# Patient Record
Sex: Female | Born: 1985 | Race: White | Marital: Married | Smoking: Never smoker
Health system: Southern US, Community
[De-identification: ages and names within clinical notes are randomized; demographics above are authoritative.]

## PROBLEM LIST (undated history)

## (undated) DIAGNOSIS — C801 Malignant (primary) neoplasm, unspecified: Secondary | ICD-10-CM

## (undated) DIAGNOSIS — N289 Disorder of kidney and ureter, unspecified: Secondary | ICD-10-CM

## (undated) DIAGNOSIS — Z8489 Family history of other specified conditions: Secondary | ICD-10-CM

## (undated) DIAGNOSIS — T8859XA Other complications of anesthesia, initial encounter: Secondary | ICD-10-CM

## (undated) DIAGNOSIS — Q8500 Neurofibromatosis, unspecified: Secondary | ICD-10-CM

## (undated) DIAGNOSIS — Z5189 Encounter for other specified aftercare: Secondary | ICD-10-CM

## (undated) HISTORY — PX: KIDNEY TRANSPLANT: SHX239

## (undated) HISTORY — PX: OTHER SURGICAL HISTORY: SHX169

## (undated) HISTORY — PX: TONSILLECTOMY: SUR1361

---
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
## 2020-01-21 ENCOUNTER — Other Ambulatory Visit: Payer: Self-pay | Admitting: Sports Medicine

## 2020-01-21 DIAGNOSIS — R29898 Other symptoms and signs involving the musculoskeletal system: Secondary | ICD-10-CM

## 2020-01-21 DIAGNOSIS — M5416 Radiculopathy, lumbar region: Secondary | ICD-10-CM

## 2020-01-25 ENCOUNTER — Ambulatory Visit
Admission: RE | Admit: 2020-01-25 | Discharge: 2020-01-25 | Disposition: A | Discharge status: Home / Self Care | Payer: Managed Care, Other (non HMO) | Source: Ambulatory Visit | Attending: Sports Medicine | Admitting: Sports Medicine

## 2020-01-25 ENCOUNTER — Other Ambulatory Visit: Payer: Self-pay

## 2020-01-25 DIAGNOSIS — M5416 Radiculopathy, lumbar region: Secondary | ICD-10-CM

## 2020-01-25 DIAGNOSIS — R29898 Other symptoms and signs involving the musculoskeletal system: Secondary | ICD-10-CM

## 2020-01-28 ENCOUNTER — Other Ambulatory Visit: Payer: Self-pay | Admitting: Family Medicine

## 2020-01-28 DIAGNOSIS — M5416 Radiculopathy, lumbar region: Secondary | ICD-10-CM

## 2020-02-03 ENCOUNTER — Other Ambulatory Visit: Payer: Self-pay

## 2020-02-03 ENCOUNTER — Ambulatory Visit
Admission: RE | Admit: 2020-02-03 | Discharge: 2020-02-03 | Disposition: A | Discharge status: Home / Self Care | Payer: Self-pay | Source: Ambulatory Visit | Attending: Family Medicine | Admitting: Family Medicine

## 2020-02-03 DIAGNOSIS — M5416 Radiculopathy, lumbar region: Secondary | ICD-10-CM

## 2020-02-03 MED ORDER — METHYLPREDNISOLONE ACETATE 40 MG/ML INJ SUSP (RADIOLOG
120.0000 mg | Freq: Once | INTRAMUSCULAR | Status: AC
  Administered 2020-02-03: 120 mg via EPIDURAL

## 2020-02-03 MED ORDER — IOPAMIDOL (ISOVUE-M 200) INJECTION 41%
1.0000 mL | Freq: Once | INTRAMUSCULAR | Status: AC
  Administered 2020-02-03: 1 mL via EPIDURAL

## 2020-02-03 NOTE — Discharge Instructions (Signed)

## 2020-04-11 ENCOUNTER — Other Ambulatory Visit: Payer: Self-pay | Admitting: Orthopedic Surgery

## 2020-04-11 DIAGNOSIS — M259 Joint disorder, unspecified: Secondary | ICD-10-CM

## 2020-04-28 ENCOUNTER — Other Ambulatory Visit: Payer: Self-pay

## 2020-04-28 ENCOUNTER — Ambulatory Visit
Admission: RE | Admit: 2020-04-28 | Discharge: 2020-04-28 | Disposition: A | Discharge status: Home / Self Care | Payer: Managed Care, Other (non HMO) | Source: Ambulatory Visit | Attending: Orthopedic Surgery | Admitting: Orthopedic Surgery

## 2020-04-28 DIAGNOSIS — M259 Joint disorder, unspecified: Secondary | ICD-10-CM

## 2020-07-03 ENCOUNTER — Emergency Department (HOSPITAL_COMMUNITY): Payer: Managed Care, Other (non HMO)

## 2020-07-03 ENCOUNTER — Emergency Department (HOSPITAL_COMMUNITY)
Admission: EM | Admit: 2020-07-03 | Discharge: 2020-07-03 | Disposition: A | Discharge status: Home / Self Care | Payer: Managed Care, Other (non HMO) | Attending: Emergency Medicine | Admitting: Emergency Medicine

## 2020-07-03 ENCOUNTER — Other Ambulatory Visit: Payer: Self-pay

## 2020-07-03 ENCOUNTER — Encounter (HOSPITAL_COMMUNITY): Payer: Self-pay

## 2020-07-03 DIAGNOSIS — J189 Pneumonia, unspecified organism: Secondary | ICD-10-CM | POA: Diagnosis not present

## 2020-07-03 DIAGNOSIS — R197 Diarrhea, unspecified: Secondary | ICD-10-CM

## 2020-07-03 DIAGNOSIS — Z859 Personal history of malignant neoplasm, unspecified: Secondary | ICD-10-CM | POA: Insufficient documentation

## 2020-07-03 DIAGNOSIS — R112 Nausea with vomiting, unspecified: Secondary | ICD-10-CM

## 2020-07-03 DIAGNOSIS — R109 Unspecified abdominal pain: Secondary | ICD-10-CM | POA: Diagnosis not present

## 2020-07-03 DIAGNOSIS — R0602 Shortness of breath: Secondary | ICD-10-CM | POA: Diagnosis present

## 2020-07-03 HISTORY — DX: Disorder of kidney and ureter, unspecified: N28.9

## 2020-07-03 HISTORY — DX: Malignant (primary) neoplasm, unspecified: C80.1

## 2020-07-03 HISTORY — DX: Encounter for other specified aftercare: Z51.89

## 2020-07-03 HISTORY — DX: Neurofibromatosis, unspecified: Q85.00

## 2020-07-03 LAB — URINALYSIS, ROUTINE W REFLEX MICROSCOPIC
Bilirubin Urine: NEGATIVE
Glucose, UA: 50 mg/dL — AB
Ketones, ur: 20 mg/dL — AB
Leukocytes,Ua: NEGATIVE
Nitrite: NEGATIVE
Protein, ur: 100 mg/dL — AB
Specific Gravity, Urine: 1.01 (ref 1.005–1.030)
pH: 9 — ABNORMAL HIGH (ref 5.0–8.0)

## 2020-07-03 LAB — HCG, QUANTITATIVE, PREGNANCY: hCG, Beta Chain, Quant, S: 1 m[IU]/mL (ref <5)

## 2020-07-03 LAB — COMPREHENSIVE METABOLIC PANEL
ALT: 30 U/L (ref 0–44)
AST: 20 U/L (ref 15–41)
Albumin: 3.3 g/dL — ABNORMAL LOW (ref 3.5–5.0)
Alkaline Phosphatase: 96 U/L (ref 38–126)
Anion gap: 16 — ABNORMAL HIGH (ref 5–15)
BUN: 26 mg/dL — ABNORMAL HIGH (ref 6–20)
CO2: 24 mmol/L (ref 22–32)
Calcium: 9.1 mg/dL (ref 8.9–10.3)
Chloride: 97 mmol/L — ABNORMAL LOW (ref 98–111)
Creatinine, Ser: 9.88 mg/dL — ABNORMAL HIGH (ref 0.44–1.00)
GFR, Estimated: 5 mL/min — ABNORMAL LOW (ref >60)
Glucose, Bld: 96 mg/dL (ref 70–99)
Potassium: 3.9 mmol/L (ref 3.5–5.1)
Sodium: 137 mmol/L (ref 135–145)
Total Bilirubin: 0.6 mg/dL (ref 0.3–1.2)
Total Protein: 7 g/dL (ref 6.5–8.1)

## 2020-07-03 LAB — CBC
HCT: 29.5 % — ABNORMAL LOW (ref 36.0–46.0)
Hemoglobin: 9.5 g/dL — ABNORMAL LOW (ref 12.0–15.0)
MCH: 29.2 pg (ref 26.0–34.0)
MCHC: 32.2 g/dL (ref 30.0–36.0)
MCV: 90.8 fL (ref 80.0–100.0)
Platelets: 518 10*3/uL — ABNORMAL HIGH (ref 150–400)
RBC: 3.25 MIL/uL — ABNORMAL LOW (ref 3.87–5.11)
RDW: 11.7 % (ref 11.5–15.5)
WBC: 5.8 10*3/uL (ref 4.0–10.5)
nRBC: 0 % (ref 0.0–0.2)

## 2020-07-03 LAB — LIPASE, BLOOD: Lipase: 32 U/L (ref 11–51)

## 2020-07-03 LAB — TROPONIN I (HIGH SENSITIVITY): Troponin I (High Sensitivity): 8 ng/L (ref <18)

## 2020-07-03 MED ORDER — ONDANSETRON HCL 4 MG/2ML IJ SOLN
4.0000 mg | Freq: Once | INTRAMUSCULAR | Status: AC
  Administered 2020-07-03: 4 mg via INTRAVENOUS
  Filled 2020-07-03: qty 2

## 2020-07-03 MED ORDER — SODIUM CHLORIDE 0.9 % IV BOLUS
500.0000 mL | Freq: Once | INTRAVENOUS | Status: AC
  Administered 2020-07-03: 500 mL via INTRAVENOUS

## 2020-07-03 MED ORDER — DOXYCYCLINE HYCLATE 100 MG PO CAPS: 100.0000 mg | ORAL_CAPSULE | Freq: Two times a day (BID) | ORAL | 0 refills | Status: DC

## 2020-07-03 NOTE — ED Triage Notes (Signed)
Patient states she was recently hospitalized with pneumonia. patient c/o RLQ abdominal pain and emesis since yesterday.

## 2020-07-03 NOTE — ED Provider Notes (Signed)
[facility] DEPT Provider Note   CSN: [id number] Arrival date & time: 07/03/20  0946     History Chief Complaint  Patient presents with  . Abdominal Pain  . Emesis    Kaitlyn Schwartz is a 35 y.o. female.  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  Patient states that she is a Monday, Wednesday, Friday dialysis patient.  She was recently admitted to the hospital for sepsis secondary to pneumonia.  She was discharged 6 days ago.  She was negative for COVID-19 as well as flu a and flu B at that time.  She states that she is chronically short of breath and her oxygen saturations are typically in the low 90s.  She wears O2 while at dialysis but otherwise does not have a chronic O2 requirement.  She states that since being discharged she has continued to experience mild shortness of breath as well as central chest tightness.  Denies any significant chest pain.  Over the past 24 hours she then began experiencing diffuse abdominal pain as well as intractable nausea, vomiting, as well as diarrhea.  She was discharged on an albuterol inhaler as well as 5 days of Augmentin which she states she completed.  She states that her nausea and vomiting is so significant that she is having a difficult time taking her regular medications, so she came to the emergency department for evaluation.  Patient denies any urinary changes.      Past Medical History:  Diagnosis Date  . Blood transfusion without reported diagnosis   . Cancer ([place])   . Neurofibromatosis ([place])   . Renal disorder     There are no problems to display for this patient.   Past Surgical History:  Procedure Laterality Date  . KIDNEY TRANSPLANT     . throat biopsy    . TONSILLECTOMY       OB History   No obstetric history on file.     Family History  Problem Relation Age of Onset  . Cancer Mother   . Hypertension Mother   . Cancer Father   . Hypertension Father     Social History   Tobacco Use  . Smoking status: Never Smoker  . Smokeless tobacco: Never Used  Vaping Use  . Vaping Use: Never used  Substance Use Topics  . Alcohol use: Not Currently  . Drug use: Never    Home Medications Prior to Admission medications   Medication Sig Start Date End Date Taking? Authorizing Provider  doxycycline (VIBRAMYCIN) 100 MG capsule Take 1 capsule (100 mg total) by mouth 2 (two) times daily for 10 days. 07/03/20 07/13/20 Yes Rayna Sexton, PA-C    Allergies    Vancomycin, Versed [midazolam], and Azithromycin  Review of Systems   Review of Systems  All other systems reviewed and are negative. Ten systems reviewed and are negative for acute change, except as noted in the HPI.   Physical Exam Updated Vital Signs BP (!) 125/96   Pulse (!) 102   Temp 98 F (36.7 C) (Oral)   Resp (!) 22   Ht 5\' 2"  (1.575 m)   Wt 76.5 kg   LMP 06/29/2020   SpO2 96%   BMI 30.85 kg/m  Physical Exam Vitals and nursing note reviewed.  Constitutional:      General: She is not in acute distress.    Appearance: Normal appearance. She is well-developed. She is not ill-appearing, toxic-appearing or diaphoretic.  HENT:     Head: Normocephalic and atraumatic.     Right Ear: External ear normal.     Left Ear: External ear normal.     Nose: Nose normal.     Mouth/Throat:     Mouth: Mucous membranes are moist.     Pharynx: Oropharynx is clear. No oropharyngeal exudate or posterior oropharyngeal erythema.  Eyes:     Extraocular Movements: Extraocular movements intact.  Cardiovascular:     Rate and Rhythm: Regular rhythm. Tachycardia present.     Pulses: Normal pulses.     Heart sounds: Normal heart sounds. No murmur heard. No friction  rub. No gallop.      Comments: Mild tachycardia.  No murmurs, rubs, or gallops.  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 Pulmonary:     Effort: Pulmonary effort is normal. No respiratory distress.     Breath sounds: No stridor. Rales present. No wheezing or rhonchi.     Comments: Crackles noted in the bilateral lung bases, left greater than right.  Speaking in clear, complete, and coherent sentences.  Oxygen saturations in the low 90s.   Abdominal:     General: Abdomen is flat.     Tenderness: There is generalized abdominal tenderness.  Musculoskeletal:        General: Normal range of motion.     Cervical back: Normal range of motion and neck supple. No tenderness.  Skin:    General: Skin is warm and dry.  Neurological:     General: No focal deficit present.     Mental Status: She is alert and oriented to person, place, and time.  Psychiatric:        Mood and Affect: Mood normal.        Behavior: Behavior normal.    ED Results / Procedures / Treatments   Labs (all labs ordered are listed, but only abnormal results are displayed) Labs Reviewed  COMPREHENSIVE METABOLIC PANEL - Abnormal; Notable for the following components:      Result Value   Chloride 97 (*)    BUN 26 (*)    Creatinine, Ser 9.88 (*)    Albumin 3.3 (*)    GFR, Estimated 5 (*)    Anion gap 16 (*)    All other components within normal limits  CBC - Abnormal; Notable for the following components:   RBC 3.25 (*)    Hemoglobin 9.5 (*)    HCT 29.5 (*)    Platelets 518 (*)    All other components within normal limits  URINALYSIS, ROUTINE W REFLEX MICROSCOPIC - Abnormal; Notable for the following components:   pH 9.0 (*)    Glucose, UA 50 (*)    Hgb urine dipstick MODERATE (*)    Ketones, ur 20 (*)    Protein, ur 100 (*)    Bacteria, UA RARE (*)    All other components within normal limits  LIPASE, BLOOD  HCG, QUANTITATIVE, PREGNANCY  I-STAT BETA HCG BLOOD, ED ([facility], [facility], [facility] ONLY)   TROPONIN I (HIGH SENSITIVITY)   EKG EKG Interpretation  Date/Time:  Sunday July 03 2020 10:16:47 EST Ventricular Rate:  95 PR Interval:    QRS Duration: 86 QT Interval:  360 QTC Calculation: 455 R Axis:   -  6 Text Interpretation: Sinus rhythm Borderline low voltage, extremity leads Consider anterior infarct Confirmed by Lennice Sites [phone]) on 07/03/2020 10:54:08 AM   Radiology CT ABDOMEN PELVIS WO CONTRAST  Result Date: 07/03/2020 CLINICAL DATA:  Abdominal pain EXAM: CT ABDOMEN AND PELVIS WITHOUT CONTRAST TECHNIQUE: Multidetector CT imaging of the abdomen and pelvis was performed following the standard protocol without IV contrast. COMPARISON:  None. FINDINGS: Lower chest: Bibasilar consolidative airspace opacities. Trace RIGHT pleural effusion. Hepatobiliary: Unremarkable noncontrast appearance of the liver. Gallbladder is unremarkable. Pancreas: No peripancreatic fat stranding. Spleen: Lobulated spleen. Adrenals/Urinary Tract: Adrenal glands are unremarkable. Atrophic native kidneys with incompletely characterized subcentimeter hypodense lesions of the LEFT kidney. Renal transplant in the RIGHT lower quadrant without evidence of hydronephrosis. Bladder is completely decompressed. Mild fat stranding adjacent to the bladder. Stomach/Bowel: Appendix is normal. No evidence of bowel obstruction. There is mild circumferential colonic wall thickening most pronounced in the transverse and descending colon with mild adjacent fat stranding. Vascular/Lymphatic: No significant vascular findings are present. No enlarged abdominal or pelvic lymph nodes. Reproductive: Uterus and bilateral adnexa are unremarkable. Other: Trace free fluid. Musculoskeletal: No acute or significant osseous findings. IMPRESSION: 1. Mild circumferential colonic wall thickening most pronounced in the transverse and descending colon with mild adjacent fat stranding. This is nonspecific and could reflect an infectious or inflammatory  colitis in the appropriate clinical setting. 2. Mild fat stranding adjacent to the bladder. Correlate with urinalysis to exclude cystitis. 3. Bibasilar consolidative airspace opacities and trace RIGHT pleural effusion, consistent with history of pneumonia. 4. Normal appendix. Electronically Signed   By: Valentino Saxon MD   On: 07/03/2020 12:59   DG Chest Portable 1 View  Result Date: 07/03/2020 CLINICAL DATA:  Cough and shortness of breath. EXAM: PORTABLE CHEST 1 VIEW COMPARISON:  None. FINDINGS: The cardiomediastinal silhouette is unremarkable. Patchy airspace opacities within both mid and lower lungs are noted. There is no evidence of pleural effusion, pneumothorax or acute bony abnormality. IMPRESSION: Patchy bilateral airspace opacities compatible with infection/pneumonia. Electronically Signed   By: Margarette Canada M.D.   On: 07/03/2020 10:44   Procedures Procedures   Medications Ordered in ED Medications  sodium chloride 0.9 % bolus 500 mL (0 mLs Intravenous Stopped 07/03/20 1218)  ondansetron (ZOFRAN) injection 4 mg (4 mg Intravenous Given 07/03/20 1038)   ED Course  I have reviewed the triage vital signs and the nursing notes.  Pertinent labs & imaging results that were available during my care of the patient were reviewed by me and considered in my medical decision making (see chart for details).  Clinical Course as of 07/03/20 1519  Sun Jul 03, 2020  1107 DG Chest Portable 1 View IMPRESSION: Patchy bilateral airspace opacities compatible with infection/pneumonia. [LJ]  1310 CT ABDOMEN PELVIS WO CONTRAST IMPRESSION: 1. Mild circumferential colonic wall thickening most pronounced in the transverse and descending colon with mild adjacent fat stranding. This is nonspecific and could reflect an infectious or inflammatory colitis in the appropriate clinical setting. 2. Mild fat stranding adjacent to the bladder. Correlate with urinalysis to exclude cystitis. 3. Bibasilar consolidative  airspace opacities and trace RIGHT pleural effusion, consistent with history of pneumonia. 4. Normal appendix. [LJ]    Clinical Course User Index [LJ] Rayna Sexton, PA-C   MDM Rules/Calculators/A&P                          Pt is a 35 y.o. female who presents the emergency department with persistent  shortness of breath from previously diagnosed pneumonia as well as nausea, vomiting, and diarrhea.  Labs: CBC with a hemoglobin of 9.5. Hematocrit of 29.5. Platelets of 518. CMP with a chloride of 97, BUN of 26, creatinine of 9.88, albumin of 3.3, GFR 5, anion gap mildly elevated at 16. Patient is a Monday, Wednesday, Friday dialysis patient. Quantitative hCG of 1. Lipase of 32. Troponin of 8. UA with a pH of 9, glucose of 50, moderate hemoglobin, 20 ketones, 100 protein, rare bacteria.  Imaging: Chest x-ray showing patchy bilateral airspace opacities compatible with infection/pneumonia. CT of the abdomen and pelvis without contrast shows mild circumferential colonic wall thickening most pronounced in the transverse and descending colon with mild adjacent fat stranding. This is a nonspecific and could reflect an infectious or inflammatory colitis in the appropriate clinical setting.  I, Rayna Sexton, PA-C, personally reviewed and evaluated these images and lab results as part of my medical decision-making.  Patient recently discharged from [facility] due to pneumonia.  She was negative for COVID-19, flu A, as well as flu B at that time.  She has completed a course of Augmentin. Yesterday she began experiencing nausea, vomiting, as well as diarrhea. CT concerning for a possible mild colitis. Feel that this is likely secondary to her significant recent antibiotic use. Patient given a 500 cc bolus as well as a dose of Zofran. She notes resolution of her nausea. She was p.o. challenged successfully. She states she is feeling much better. Recommended that she continue taking her Zofran as well as  famotidine, which she has already been prescribed.  Given her persistent pneumonia, will start her on a 10-day course of doxycycline.  She previously was taking Augmentin.  She has an appointment with her PCP in 4 days.  She is planning on being dialyzed tomorrow.  She verbalized understanding of the above plan.  Feel the patient is stable for discharge at this time and she is agreeable.  Her questions were answered and she was amicable at the time of discharge.  Note: Portions of this report may have been transcribed using voice recognition software. Every effort was made to ensure accuracy; however, inadvertent computerized transcription errors may be present.   Final Clinical Impression(s) / ED Diagnoses Final diagnoses:  Nausea vomiting and diarrhea  Community acquired pneumonia, unspecified laterality    Rx / DC Orders ED Discharge Orders         Ordered    doxycycline (VIBRAMYCIN) 100 MG capsule  2 times daily        07/03/20 1432           Rayna Sexton, PA-C 07/03/20 1519    Lennice Sites, DO 07/04/20 [phone]

## 2020-07-03 NOTE — Discharge Instructions (Addendum)
Like we discussed, please continue taking your famotidine as well as your Zofran as prescribed.  This will help with your upper abdominal pain as well as your nausea and vomiting.  I would recommend you continue eating and drinking small meals throughout the day.  This will help prevent continued stomach irritation.  Given your continued pneumonia, I am going to prescribe you a 10-day course of doxycycline.  Please take this twice a day for the next 10 days.  Do not stop taking this medication early.  Please follow-up with your regular doctor at your appointment next week.  Please make sure that you go to your scheduled dialysis session tomorrow.  If you develop any new or worsening symptoms, please return to the emergency department.  It was a pleasure to meet you.

## 2020-07-03 NOTE — ED Notes (Signed)
Patient assisted to restroom using wheelchair.

## 2020-07-03 NOTE — ED Notes (Signed)
Patient provided with water for PO challenge.

## 2020-07-07 ENCOUNTER — Emergency Department (HOSPITAL_COMMUNITY): Payer: Managed Care, Other (non HMO)

## 2020-07-07 ENCOUNTER — Other Ambulatory Visit: Payer: Self-pay

## 2020-07-07 ENCOUNTER — Inpatient Hospital Stay (HOSPITAL_COMMUNITY)
Admission: EM | Admit: 2020-07-07 | Discharge: 2020-07-16 | DRG: 193 | Disposition: A | Discharge status: Home / Self Care | Payer: Managed Care, Other (non HMO) | Attending: Internal Medicine | Admitting: Internal Medicine

## 2020-07-07 DIAGNOSIS — J9600 Acute respiratory failure, unspecified whether with hypoxia or hypercapnia: Secondary | ICD-10-CM

## 2020-07-07 DIAGNOSIS — Z7682 Awaiting organ transplant status: Secondary | ICD-10-CM | POA: Diagnosis not present

## 2020-07-07 DIAGNOSIS — J9601 Acute respiratory failure with hypoxia: Secondary | ICD-10-CM | POA: Insufficient documentation

## 2020-07-07 DIAGNOSIS — N186 End stage renal disease: Secondary | ICD-10-CM

## 2020-07-07 DIAGNOSIS — Z8701 Personal history of pneumonia (recurrent): Secondary | ICD-10-CM | POA: Diagnosis not present

## 2020-07-07 DIAGNOSIS — R069 Unspecified abnormalities of breathing: Secondary | ICD-10-CM

## 2020-07-07 DIAGNOSIS — Y95 Nosocomial condition: Secondary | ICD-10-CM | POA: Diagnosis present

## 2020-07-07 DIAGNOSIS — T8612 Kidney transplant failure: Secondary | ICD-10-CM | POA: Diagnosis present

## 2020-07-07 DIAGNOSIS — N2581 Secondary hyperparathyroidism of renal origin: Secondary | ICD-10-CM | POA: Diagnosis present

## 2020-07-07 DIAGNOSIS — Z992 Dependence on renal dialysis: Secondary | ICD-10-CM

## 2020-07-07 DIAGNOSIS — D84821 Immunodeficiency due to drugs: Secondary | ICD-10-CM | POA: Diagnosis present

## 2020-07-07 DIAGNOSIS — Z792 Long term (current) use of antibiotics: Secondary | ICD-10-CM

## 2020-07-07 DIAGNOSIS — D631 Anemia in chronic kidney disease: Secondary | ICD-10-CM | POA: Diagnosis present

## 2020-07-07 DIAGNOSIS — J159 Unspecified bacterial pneumonia: Principal | ICD-10-CM | POA: Diagnosis present

## 2020-07-07 DIAGNOSIS — B259 Cytomegaloviral disease, unspecified: Secondary | ICD-10-CM | POA: Diagnosis present

## 2020-07-07 DIAGNOSIS — Z7952 Long term (current) use of systemic steroids: Secondary | ICD-10-CM

## 2020-07-07 DIAGNOSIS — Z809 Family history of malignant neoplasm, unspecified: Secondary | ICD-10-CM

## 2020-07-07 DIAGNOSIS — Z888 Allergy status to other drugs, medicaments and biological substances status: Secondary | ICD-10-CM

## 2020-07-07 DIAGNOSIS — E669 Obesity, unspecified: Secondary | ICD-10-CM | POA: Diagnosis present

## 2020-07-07 DIAGNOSIS — F418 Other specified anxiety disorders: Secondary | ICD-10-CM | POA: Diagnosis present

## 2020-07-07 DIAGNOSIS — Z79899 Other long term (current) drug therapy: Secondary | ICD-10-CM | POA: Diagnosis not present

## 2020-07-07 DIAGNOSIS — Y830 Surgical operation with transplant of whole organ as the cause of abnormal reaction of the patient, or of later complication, without mention of misadventure at the time of the procedure: Secondary | ICD-10-CM | POA: Diagnosis present

## 2020-07-07 DIAGNOSIS — D47Z1 Post-transplant lymphoproliferative disorder (PTLD): Secondary | ICD-10-CM | POA: Diagnosis present

## 2020-07-07 DIAGNOSIS — J9621 Acute and chronic respiratory failure with hypoxia: Secondary | ICD-10-CM | POA: Diagnosis present

## 2020-07-07 DIAGNOSIS — E785 Hyperlipidemia, unspecified: Secondary | ICD-10-CM | POA: Diagnosis present

## 2020-07-07 DIAGNOSIS — J189 Pneumonia, unspecified organism: Secondary | ICD-10-CM

## 2020-07-07 DIAGNOSIS — Z20822 Contact with and (suspected) exposure to covid-19: Secondary | ICD-10-CM | POA: Diagnosis present

## 2020-07-07 DIAGNOSIS — Z880 Allergy status to penicillin: Secondary | ICD-10-CM

## 2020-07-07 DIAGNOSIS — M898X9 Other specified disorders of bone, unspecified site: Secondary | ICD-10-CM | POA: Diagnosis present

## 2020-07-07 DIAGNOSIS — J80 Acute respiratory distress syndrome: Secondary | ICD-10-CM

## 2020-07-07 DIAGNOSIS — Z881 Allergy status to other antibiotic agents status: Secondary | ICD-10-CM

## 2020-07-07 DIAGNOSIS — T8611 Kidney transplant rejection: Secondary | ICD-10-CM | POA: Diagnosis present

## 2020-07-07 DIAGNOSIS — Z6830 Body mass index (BMI) 30.0-30.9, adult: Secondary | ICD-10-CM

## 2020-07-07 HISTORY — DX: Family history of other specified conditions: Z84.89

## 2020-07-07 HISTORY — DX: Other complications of anesthesia, initial encounter: T88.59XA

## 2020-07-07 LAB — CBC WITH DIFFERENTIAL/PLATELET
Abs Immature Granulocytes: 0.1 10*3/uL — ABNORMAL HIGH (ref 0.00–0.07)
Basophils Absolute: 0.1 10*3/uL (ref 0.0–0.1)
Basophils Relative: 1 %
Eosinophils Absolute: 0.2 10*3/uL (ref 0.0–0.5)
Eosinophils Relative: 3 %
HCT: 31 % — ABNORMAL LOW (ref 36.0–46.0)
Hemoglobin: 10 g/dL — ABNORMAL LOW (ref 12.0–15.0)
Immature Granulocytes: 1 %
Lymphocytes Relative: 15 %
Lymphs Abs: 1.3 10*3/uL (ref 0.7–4.0)
MCH: 29.1 pg (ref 26.0–34.0)
MCHC: 32.3 g/dL (ref 30.0–36.0)
MCV: 90.1 fL (ref 80.0–100.0)
Monocytes Absolute: 0.8 10*3/uL (ref 0.1–1.0)
Monocytes Relative: 9 %
Neutro Abs: 6 10*3/uL (ref 1.7–7.7)
Neutrophils Relative %: 71 %
Platelets: 513 10*3/uL — ABNORMAL HIGH (ref 150–400)
RBC: 3.44 MIL/uL — ABNORMAL LOW (ref 3.87–5.11)
RDW: 11.9 % (ref 11.5–15.5)
WBC: 8.4 10*3/uL (ref 4.0–10.5)
nRBC: 0 % (ref 0.0–0.2)

## 2020-07-07 LAB — BASIC METABOLIC PANEL
Anion gap: 13 (ref 5–15)
BUN: 12 mg/dL (ref 6–20)
CO2: 27 mmol/L (ref 22–32)
Calcium: 8.6 mg/dL — ABNORMAL LOW (ref 8.9–10.3)
Chloride: 99 mmol/L (ref 98–111)
Creatinine, Ser: 7.83 mg/dL — ABNORMAL HIGH (ref 0.44–1.00)
GFR, Estimated: 6 mL/min — ABNORMAL LOW (ref >60)
Glucose, Bld: 89 mg/dL (ref 70–99)
Potassium: 3.6 mmol/L (ref 3.5–5.1)
Sodium: 139 mmol/L (ref 135–145)

## 2020-07-07 LAB — MRSA PCR SCREENING: MRSA by PCR: NEGATIVE

## 2020-07-07 LAB — RESP PANEL BY RT-PCR (FLU A&B, COVID) ARPGX2
Influenza A by PCR: NEGATIVE
Influenza B by PCR: NEGATIVE
SARS Coronavirus 2 by RT PCR: NEGATIVE

## 2020-07-07 LAB — I-STAT BETA HCG BLOOD, ED (MC, WL, AP ONLY): I-stat hCG, quantitative: <5 m[IU]/mL (ref <5)

## 2020-07-07 LAB — TROPONIN I (HIGH SENSITIVITY)
Troponin I (High Sensitivity): 14 ng/L (ref <18)
Troponin I (High Sensitivity): 11 ng/L (ref <18)

## 2020-07-07 LAB — LACTIC ACID, PLASMA: Lactic Acid, Venous: 0.7 mmol/L (ref 0.5–1.9)

## 2020-07-07 MED ORDER — HEPARIN SODIUM (PORCINE) 5000 UNIT/ML IJ SOLN
5000.0000 [IU] | Freq: Two times a day (BID) | INTRAMUSCULAR | Status: DC
  Administered 2020-07-07 – 2020-07-16 (×17): 5000 [IU] via SUBCUTANEOUS
  Filled 2020-07-07 (×17): qty 1

## 2020-07-07 MED ORDER — IOHEXOL 350 MG/ML SOLN
75.0000 mL | Freq: Once | INTRAVENOUS | Status: AC | PRN
  Administered 2020-07-07: 75 mL via INTRAVENOUS

## 2020-07-07 MED ORDER — SODIUM BICARBONATE 650 MG PO TABS
1300.0000 mg | ORAL_TABLET | Freq: Every day | ORAL | Status: DC
  Administered 2020-07-08 – 2020-07-10 (×2): 1300 mg via ORAL
  Filled 2020-07-07 (×3): qty 2

## 2020-07-07 MED ORDER — SEVELAMER CARBONATE 800 MG PO TABS
1600.0000 mg | ORAL_TABLET | Freq: Three times a day (TID) | ORAL | Status: DC
  Administered 2020-07-08 (×3): 1600 mg via ORAL
  Filled 2020-07-07 (×4): qty 2

## 2020-07-07 MED ORDER — PREDNISONE 5 MG PO TABS
5.0000 mg | ORAL_TABLET | Freq: Every day | ORAL | Status: DC
  Administered 2020-07-09: 5 mg via ORAL
  Filled 2020-07-07 (×2): qty 1

## 2020-07-07 MED ORDER — VANCOMYCIN HCL 1500 MG/300ML IV SOLN
1500.0000 mg | Freq: Once | INTRAVENOUS | Status: AC
  Administered 2020-07-07: 1500 mg via INTRAVENOUS
  Filled 2020-07-07: qty 300

## 2020-07-07 MED ORDER — GUAIFENESIN ER 600 MG PO TB12
1200.0000 mg | ORAL_TABLET | Freq: Two times a day (BID) | ORAL | Status: DC
  Administered 2020-07-07 – 2020-07-16 (×18): 1200 mg via ORAL
  Filled 2020-07-07 (×18): qty 2

## 2020-07-07 MED ORDER — BENZONATATE 100 MG PO CAPS
200.0000 mg | ORAL_CAPSULE | Freq: Two times a day (BID) | ORAL | Status: DC
  Administered 2020-07-07 – 2020-07-16 (×18): 200 mg via ORAL
  Filled 2020-07-07 (×18): qty 2

## 2020-07-07 MED ORDER — METHYLPHENIDATE HCL ER 10 MG PO TBCR: 10.0000 mg | EXTENDED_RELEASE_TABLET | Freq: Every day | ORAL | Status: DC

## 2020-07-07 MED ORDER — VANCOMYCIN HCL IN DEXTROSE 750-5 MG/150ML-% IV SOLN
750.0000 mg | INTRAVENOUS | Status: DC
  Administered 2020-07-08: 750 mg via INTRAVENOUS
  Filled 2020-07-07: qty 150

## 2020-07-07 MED ORDER — AMLODIPINE BESYLATE 2.5 MG PO TABS
2.5000 mg | ORAL_TABLET | Freq: Every day | ORAL | Status: DC
  Administered 2020-07-09 – 2020-07-15 (×7): 2.5 mg via ORAL
  Filled 2020-07-07 (×10): qty 1

## 2020-07-07 MED ORDER — BACID PO TABS
2.0000 | ORAL_TABLET | Freq: Every day | ORAL | Status: DC
  Administered 2020-07-08: 2 via ORAL
  Filled 2020-07-07: qty 2

## 2020-07-07 MED ORDER — DIPHENHYDRAMINE HCL 50 MG/ML IJ SOLN
12.5000 mg | Freq: Once | INTRAMUSCULAR | Status: AC
  Administered 2020-07-07: 12.5 mg via INTRAVENOUS
  Filled 2020-07-07: qty 1

## 2020-07-07 MED ORDER — BACID PO TABS
2.0000 | ORAL_TABLET | Freq: Three times a day (TID) | ORAL | Status: DC
  Filled 2020-07-07: qty 2

## 2020-07-07 MED ORDER — ACYCLOVIR 400 MG PO TABS
400.0000 mg | ORAL_TABLET | Freq: Every day | ORAL | Status: DC
  Administered 2020-07-08 – 2020-07-12 (×4): 400 mg via ORAL
  Filled 2020-07-07 (×5): qty 1

## 2020-07-07 MED ORDER — IPRATROPIUM-ALBUTEROL 0.5-2.5 (3) MG/3ML IN SOLN
3.0000 mL | Freq: Four times a day (QID) | RESPIRATORY_TRACT | Status: DC
  Administered 2020-07-07 – 2020-07-08 (×2): 3 mL via RESPIRATORY_TRACT
  Filled 2020-07-07 (×2): qty 3

## 2020-07-07 MED ORDER — BUPROPION HCL 100 MG PO TABS
100.0000 mg | ORAL_TABLET | Freq: Every day | ORAL | Status: DC
  Administered 2020-07-08 – 2020-07-16 (×8): 100 mg via ORAL
  Filled 2020-07-07 (×11): qty 1

## 2020-07-07 MED ORDER — LOSARTAN POTASSIUM 50 MG PO TABS
50.0000 mg | ORAL_TABLET | Freq: Every day | ORAL | Status: DC
  Administered 2020-07-08: 50 mg via ORAL
  Filled 2020-07-07: qty 1

## 2020-07-07 MED ORDER — SIROLIMUS 1 MG PO TABS
2.0000 mg | ORAL_TABLET | Freq: Every day | ORAL | Status: DC
  Administered 2020-07-08 – 2020-07-16 (×9): 2 mg via ORAL
  Filled 2020-07-07 (×11): qty 2

## 2020-07-07 MED ORDER — SODIUM CHLORIDE 0.9 % IV SOLN
1.0000 g | INTRAVENOUS | Status: DC
  Administered 2020-07-09 (×2): 1 g via INTRAVENOUS
  Filled 2020-07-07 (×4): qty 1

## 2020-07-07 MED ORDER — ESCITALOPRAM OXALATE 20 MG PO TABS
20.0000 mg | ORAL_TABLET | Freq: Every day | ORAL | Status: DC
  Administered 2020-07-08 – 2020-07-16 (×8): 20 mg via ORAL
  Filled 2020-07-07: qty 2
  Filled 2020-07-07 (×2): qty 1
  Filled 2020-07-07: qty 2
  Filled 2020-07-07 (×2): qty 1
  Filled 2020-07-07: qty 2
  Filled 2020-07-07: qty 1
  Filled 2020-07-07: qty 2

## 2020-07-07 MED ORDER — SODIUM CHLORIDE 0.9 % IV SOLN
1.0000 g | Freq: Once | INTRAVENOUS | Status: AC
  Administered 2020-07-07: 1 g via INTRAVENOUS
  Filled 2020-07-07 (×2): qty 1

## 2020-07-07 MED ORDER — SIMVASTATIN 20 MG PO TABS
10.0000 mg | ORAL_TABLET | Freq: Every day | ORAL | Status: DC
  Administered 2020-07-08 – 2020-07-15 (×8): 10 mg via ORAL
  Filled 2020-07-07: qty 0.5
  Filled 2020-07-07 (×3): qty 1
  Filled 2020-07-07: qty 0.5
  Filled 2020-07-07: qty 1
  Filled 2020-07-07 (×2): qty 0.5

## 2020-07-07 NOTE — ED Triage Notes (Signed)
Pt BIB [facility] from a Dr.'s office. Pt states she was diagnosed with Pneumonia 3 weeks ago. Pt still SHOB especially with exertion. At the Dr.'s office pt was 75% on RA. Pt palced on 3L now sating 94%. Pt denies any pain at this time. Pt is a dialysis pt, M,W,F. Pt did have a full treatment yesterday.

## 2020-07-07 NOTE — ED Notes (Signed)
1 blood culture set sent. Pt is a hard stick as pt is on dialysis with restrictions to left arm. Per provider, 1 blood culture set is enough and no need for 2nd culture to be sent. Will continue to monitor.

## 2020-07-07 NOTE — Progress Notes (Addendum)
Pharmacy Antibiotic Note  Kaitlyn Schwartz is a 35 y.o. female admitted on 07/07/2020 with pneumonia. Patient has history of ESRD on HD MWF and kidney transplant.  Pharmacy has been consulted for vancomycin and cefepime dosing.  Plan: Vancomycin 1500 mg IV x1, followed by 750 mg IV every MWF after HD Cefepime 1g IV every 24 hours Monitor HD sessions, C&S, clinical status, vanc levels as indicated  Height: 5\' 2"  (157.5 cm) Weight: 73.9 kg (163 lb) IBW/kg (Calculated) : 50.1  Temp (24hrs), Avg:98.5 F (36.9 C), Min:98.5 F (36.9 C), Max:98.5 F (36.9 C)  Recent Labs  Lab 07/03/20 1022 07/07/20 1411 07/07/20 1459  WBC 5.8 8.4  --   CREATININE 9.88* 7.83*  --   LATICACIDVEN  --   --  0.7    Estimated Creatinine Clearance: 9.4 mL/min (A) (by C-G formula based on SCr of 7.83 mg/dL (H)).    Allergies  Allergen Reactions  . Vancomycin Itching  . Versed [Midazolam] Anxiety  . Azithromycin     IV only    Antimicrobials this admission: Vancomycin 3/10 >  Cefepime 3/10 x1  Dose adjustments this admission: N/A  Microbiology results: 3/10 Bcx:   Thank you for allowing pharmacy to be a part of this patient's care.  Romilda Garret, PharmD PGY1 Acute Care Pharmacy Resident 07/07/2020 5:57 PM  Please check [email or web address] for unit specific pharmacy phone numbers.

## 2020-07-07 NOTE — ED Provider Notes (Signed)
[place] EMERGENCY DEPARTMENT Provider Note   CSN: [id number] Arrival date & time: 07/07/20  1351     History Chief Complaint  Patient presents with  . Shortness of Breath    Kaitlyn Schwartz is a 35 y.o. female with history significant for ESRD, hemodialysis Monday Wednesday Friday, kidney transplant who presents for evaluation of hypoxia, fever.  Symptoms started approximately 2 weeks ago.  Initially seen by urgent care had negative Covid test was given doxycycline.  Subsequently admitted to [facility] for pneumonia. Upon discharge was dc with Augmentin.  Was seen here in the emergency department 4 days ago for persistent fevers. CT abdomen and pelvis performed which showed colitis, likely from recent Augmentin use. Diarrhea and abdominal cramping resolved after Augmentin was switched to doxycycline.  She is currently on antibiotics.  Still having fevers, most recently yesterday evening.  Temp 101 yesterday evening.  States she feels short of breath with little movement.  Occasionally needs oxygen prior to dialysis however he is not on oxygen chronically at home. Has pleuritic chest pain, difficult to take a deep breath. No hemoptysis.  No unilateral leg swelling, redness or warmth.  No personal history of PE, DVT requires critical care with states she has had family history of.  No recent surgery or immobilization.  Denies additional aggravating or alleviating factors.  Does have some congestion and rhinorrhea.  Cough productive of dark brown sputum.  Denies any hemoptysis.  Patient had follow-up appointment with [facility] office today.  She sees Marda Stalker as her PCP.  Was noted to be hypoxic at 75%.  Subsequently sent to the emergency department for further evaluation.  History obtained from patient and past medical records.  No interpreter used  HPI     Past Medical History:  Diagnosis Date  . Blood transfusion without reported diagnosis   . Cancer ([place])   .  Neurofibromatosis ([place])   . Renal disorder     There are no problems to display for this patient.   Past Surgical History:  Procedure Laterality Date  . KIDNEY TRANSPLANT    . throat biopsy    . TONSILLECTOMY       OB History   No obstetric history on file.     Family History  Problem Relation Age of Onset  . Cancer Mother   . Hypertension Mother   . Cancer Father   . Hypertension Father     Social History   Tobacco Use  . Smoking status: Never Smoker  . Smokeless tobacco: Never Used  Vaping Use  . Vaping Use: Never used  Substance Use Topics  . Alcohol use: Not Currently  . Drug use: Never    Home Medications Prior to Admission medications   Medication Sig Start Date End Date Taking? Authorizing Provider  doxycycline (VIBRAMYCIN) 100 MG capsule Take 1 capsule (100 mg total) by mouth 2 (two) times daily for 10 days. 07/03/20 07/13/20  Rayna Sexton, PA-C    Allergies    Vancomycin, Versed [midazolam], and Azithromycin  Review of Systems   Review of Systems  Constitutional: Positive for activity change, appetite change, fatigue and fever.  HENT: Positive for congestion and rhinorrhea.   Respiratory: Positive for cough and shortness of breath. Negative for apnea, choking, chest tightness, wheezing and stridor.   Cardiovascular: Positive for chest pain (Pleuritic ).  Gastrointestinal: Negative.   Genitourinary: Negative.   Musculoskeletal: Negative.   Neurological: Negative.   All other systems reviewed and are negative.  Physical Exam Updated Vital Signs BP 113/74 (BP Location: Right Arm)   Pulse (!) 111   Temp 98.5 F (36.9 C) (Oral)   Resp (!) 28   Ht 5\' 2"  (1.575 m)   Wt 73.9 kg   LMP 06/29/2020   SpO2 97%   BMI 29.81 kg/m   Physical Exam Vitals and nursing note reviewed.  Constitutional:      General: She is not in acute distress.    Appearance: She is well-developed. She is not ill-appearing, toxic-appearing or diaphoretic.  HENT:      Head: Normocephalic and atraumatic.     Mouth/Throat:     Mouth: Mucous membranes are moist.  Eyes:     Pupils: Pupils are equal, round, and reactive to light.  Cardiovascular:     Rate and Rhythm: Tachycardia present.     Pulses: Normal pulses.     Heart sounds: Normal heart sounds.     Arteriovenous access: left arteriovenous access is present. Pulmonary:     Effort: Pulmonary effort is normal. Tachypnea present. No respiratory distress.     Comments: Very minimal rhonchi lower lobes.  Splints with deep breaths.  Speaks in full sentences.  On supplemental oxygen of 3.5 L Chest:     Comments: Equal rise and fall to chest wall Abdominal:     General: Bowel sounds are normal. There is no distension.     Palpations: Abdomen is soft. There is no mass.     Tenderness: There is no abdominal tenderness. There is no guarding or rebound.     Comments: Soft, nontender without rebound or guard  Musculoskeletal:        General: Normal range of motion.     Cervical back: Normal range of motion.     Right lower leg: No tenderness.     Left lower leg: No tenderness.     Comments: Moves all 4 extremities at difficulty.  Trace edema at ankles.  No bony tenderness.  Fistula left forearm, palpable  Skin:    General: Skin is warm and dry.     Capillary Refill: Capillary refill takes less than 2 seconds.     Comments: Trace non pitting edema at ankles.  No unilateral leg swelling, redness or warmth.  Neurological:     General: No focal deficit present.     Mental Status: She is alert and oriented to person, place, and time.     Comments: Cranial nerves II to XII grossly intact    ED Results / Procedures / Treatments   Labs (all labs ordered are listed, but only abnormal results are displayed) Labs Reviewed  CBC WITH DIFFERENTIAL/PLATELET - Abnormal; Notable for the following components:      Result Value   RBC 3.44 (*)    Hemoglobin 10.0 (*)    HCT 31.0 (*)    Platelets 513 (*)    Abs  Immature Granulocytes 0.10 (*)    All other components within normal limits  BASIC METABOLIC PANEL - Abnormal; Notable for the following components:   Creatinine, Ser 7.83 (*)    Calcium 8.6 (*)    GFR, Estimated 6 (*)    All other components within normal limits  RESP PANEL BY RT-PCR (FLU A&B, COVID) ARPGX2  CULTURE, BLOOD (ROUTINE X 2)  CULTURE, BLOOD (ROUTINE X 2)  LACTIC ACID, PLASMA  LACTIC ACID, PLASMA  I-STAT BETA HCG BLOOD, ED ([facility], [facility], [facility] ONLY)  TROPONIN I (HIGH SENSITIVITY)  TROPONIN I (HIGH SENSITIVITY)  EKG EKG Interpretation  Date/Time:  Thursday July 07 2020 14:17:24 EST Ventricular Rate:  111 PR Interval:    QRS Duration: 76 QT Interval:  334 QTC Calculation: 456 R Axis:   -43 Text Interpretation: Age not entered, assumed to be  35 years old for purpose of ECG interpretation Atrial fibrillation Inferior infarct, old Anterior infarct, old When compared to prior, faster rate. No STEMI Confirmed by Antony Blackbird [phone]) on 07/07/2020 3:29:44 PM   Radiology CT Angio Chest PE W/Cm &/Or Wo Cm  Result Date: 07/07/2020 CLINICAL DATA:  Pneumonia 3 weeks ago shortness of breath EXAM: CT ANGIOGRAPHY CHEST WITH CONTRAST TECHNIQUE: Multidetector CT imaging of the chest was performed using the standard protocol during bolus administration of intravenous contrast. Multiplanar CT image reconstructions and MIPs were obtained to evaluate the vascular anatomy. CONTRAST:  67mL OMNIPAQUE IOHEXOL 350 MG/ML SOLN COMPARISON:  None. FINDINGS: Cardiovascular: There is a optimal opacification of the pulmonary arteries. There is no central,segmental, or subsegmental filling defects within the pulmonary arteries. The heart is normal in size. A small pericardial effusion is seen. No evidence right heart strain. There is normal three-vessel brachiocephalic anatomy without proximal stenosis. The thoracic aorta is normal in appearance. Mediastinum/Nodes: No hilar, mediastinal, or axillary  adenopathy. Thyroid gland, trachea, and esophagus demonstrate no significant findings. Lungs/Pleura: Extensive multifocal patchy ground-glass opacities are seen throughout both lungs. There is a small right and trace left pleural effusion present. Upper Abdomen: No acute abnormalities present in the visualized portions of the upper abdomen. Musculoskeletal: No chest wall abnormality. No acute or significant osseous findings. Mild S-shaped scoliotic curvature is noted. Review of the MIP images confirms the above findings. IMPRESSION: No central, segmental, or subsegmental pulmonary embolism Extensive multifocal airspace opacities, consistent with multifocal pneumonia Small pericardial effusion Small right and trace left pleural effusion. Electronically Signed   By: Prudencio Pair M.D.   On: 07/07/2020 16:56   DG Chest Portable 1 View  Result Date: 07/07/2020 CLINICAL DATA:  Shortness of breath EXAM: PORTABLE CHEST 1 VIEW COMPARISON:  July 03, 2020 FINDINGS: There is airspace opacity in the lower lobe regions, slightly progressed. There is new infiltrate throughout portions of the left and right mid lung regions and portions of the right upper lobe. Heart size and pulmonary vascular normal. No adenopathy. No bone lesions. IMPRESSION: Multifocal pneumonia with progression of infiltrate in the right upper lobe as well as in the mid lung regions. Extensive infiltrate in each lower lobe persists without significant change from recent study. Question atypical organism pneumonia given this appearance. Check of COVID-19 status advised. Heart size normal.  No adenopathy evident. Electronically Signed   By: Lowella Grip III M.D.   On: 07/07/2020 15:08    Procedures .Critical Care Performed by: Nettie Elm, PA-C Authorized by: Nettie Elm, PA-C   Critical care provider statement:    Critical care time (minutes):  45   Critical care was necessary to treat or prevent imminent or life-threatening  deterioration of the following conditions:  Respiratory failure   Critical care was time spent personally by me on the following activities:  Discussions with consultants, evaluation of patient's response to treatment, examination of patient, ordering and performing treatments and interventions, ordering and review of laboratory studies, ordering and review of radiographic studies, pulse oximetry, re-evaluation of patient's condition, obtaining history from patient or surrogate and review of old charts     Medications Ordered in ED Medications  ceFEPIme (MAXIPIME) 1 g in sodium chloride 0.9 %  100 mL IVPB (has no administration in time range)  diphenhydrAMINE (BENADRYL) injection 12.5 mg (has no administration in time range)  vancomycin (VANCOREADY) IVPB 1500 mg/300 mL (has no administration in time range)  vancomycin (VANCOCIN) IVPB 750 mg/150 ml premix (has no administration in time range)  iohexol (OMNIPAQUE) 350 MG/ML injection 75 mL (75 mLs Intravenous Contrast Given 07/07/20 1625)    ED Course  I have reviewed the triage vital signs and the nursing notes.  Pertinent labs & imaging results that were available during my care of the patient were reviewed by me and considered in my medical decision making (see chart for details).  35 year old ESRD patient presents for evaluation of hypoxia.  Has been seen multiple times for recurrent pneumonia, actually had admission at [facility] for pneumonia.  She has been on multiple rounds of antibiotics.  She is currently on doxycycline.  Seen by PCP and noted to be hypoxic to 75%.  No clinical evidence of DVT on exam.  Does have some very mild rhonchi at bases.  Her abdomen is soft, nontender.  Did do full dialysis session yesterday.  Clinically I am concerned that patient has PE given worsening shortness of breath despite antibiotics and still having persistent low-grade fevers at home.  Will obtain labs, CTA chest to rule out PE as well as assess for any  underlying pneumonia.  Does not appear grossly fluid overloaded to need emergent dialysis.  She is hypoxic she will need admission for further management.  She does not appear septic.  We will hold on antibiotics.  If CT scan does show pneumonia would likely need broad-spectrum to cover for HCAP given she does do dialysis multiple times a week.  Hold on fluids.  Labs and imaging personally reviewed and interpreted:  CBC without leukocytosis BMP creatinine 7.83 Lactic acid 0.7 Pregnancy test negative Troponin 8>>>14 DG chest focal pneumonia progressive infiltrate CTA chest multifocal pneumonia.  No PE EKG without ischemic changes  Patient reassessed.  Comfortable on 4 L via facemask.  She does not want nasal cannula.  Discussed results.  Will broaden antibiotics, treat for HCAP.  Per Epic Vanc allergy.  Patient states she had some itching with prior admin years ago.  Patient states she would like to attempt again, will pretreat with Benadryl.  Will need to be admitted for acute hypoxic respiratory failure with CT showing multifocal pneumonia despite prior IV antibiotics and p.o. antibiotics outpatient.  CONSULT with Dr. Roosevelt Locks with [facility] who agrees to evaluate patient for admission.  The patient appears reasonably stabilized for admission considering the current resources, flow, and capabilities available in the ED at this time, and I doubt any other [facility] requiring further screening and/or treatment in the ED prior to admission.  Patient discussed with attending, Dr. Sherry Ruffing who agrees with above treatment, plan and disposition.    MDM Rules/Calculators/A&P                           Final Clinical Impression(s) / ED Diagnoses Final diagnoses:  Multifocal pneumonia  Acute respiratory failure with hypoxia ([place])  ESRD (end stage renal disease) [facility])    Rx / DC Orders ED Discharge Orders    None       Henderly, Britni A, PA-C 07/07/20 1833    Tegeler, Gwenyth Allegra, MD 07/08/20  [phone]

## 2020-07-07 NOTE — H&P (Addendum)
History and Physical    Kaitlyn Schwartz SWN:[id number] DOB: [birth year] DOA: 07/07/2020  PCP: Kaitlyn Stalker, PA-C (Confirm with patient/family/NH records and if not entered, this has to be entered at [facility] point of entry) Patient coming from: Home  I have personally briefly reviewed patient's old medical records in [place]  Chief Complaint: SOB  HPI: Kaitlyn Schwartz is a 35 y.o. 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  Patient developed first pneumonia on 06/24/2020 and went to [facility] ED, at that time patient had a high fever 103, O2 saturation 92% on room air, WBC 16, Covid negative, chest x-ray showed bilateral multifocal, right>left pneumonia, patient was started ceftriaxone and Zithromax, but Zithromax was discontinued due to poor tolerance, gradually patient symptoms improved and oxygen went off and patient sent home with p.o. Augmentin, which patient completed 9 days ago (developed abdominal cramping and diarrhea during Augmentin treatment).  However, patient's symptoms of cough and shortness of breath came back last week and went to PCP who started the patient on doxycycline, patient took for 2 days and did not tolerate for reflux problem and she stopped taking doxycycline 2 days ago.  Patient also reported low-grade fever upper night denies, cough has been dry, no chest pains, no diarrhea or abdominal pain.  Today patient went to see her PCP was found O2 saturation 75% on room air and stabilized on 3 L with saturation 94%.  PCP sent patient to ED.   ED Course: Patient was found hypoxic, placed on 4 L.  CT angiogram showed negative PE but multifocal pneumonia.  Review of Systems: As per HPI otherwise 10 point review of systems negative.     Past Medical History:  Diagnosis Date  . Blood transfusion without reported diagnosis   . Cancer ([place])   . Neurofibromatosis ([place])   . Renal disorder     Past Surgical History:  Procedure Laterality Date  . KIDNEY TRANSPLANT    . throat biopsy    . TONSILLECTOMY       reports that she has never smoked. She has never used smokeless tobacco. She reports previous alcohol use. She reports that she does not use drugs.  Allergies  Allergen Reactions  . Vancomycin Itching  . Versed [Midazolam] Anxiety  . Azithromycin     IV only    Family History  Problem Relation Age of Onset  . Cancer Mother   . Hypertension Mother   . Cancer Father   . Hypertension Father      Prior to Admission medications   Medication Sig Start Date End Date Taking? Authorizing Provider  doxycycline (VIBRAMYCIN) 100 MG capsule Take 1 capsule (100 mg total) by mouth 2 (two) times daily for 10 days. 07/03/20 07/13/20  Rayna Sexton, PA-C    Physical Exam: Vitals:   07/07/20 1517 07/07/20 1600 07/07/20 1705 07/07/20 1826  BP: 133/79 113/77 118/69 113/74  Pulse: 100 (!) 109 (!) 107 (!) 111  Resp: (!) 23 (!) 24 (!) 31 (!) 28  Temp:      TempSrc:      SpO2: 97% 98% 98% 97%  Weight:      Height:        Constitutional: NAD, calm, comfortable Vitals:   07/07/20 1517 07/07/20 1600 07/07/20 1705 07/07/20 1826  BP: 133/79 113/77  118/69 113/74  Pulse: 100 (!) 109 (!) 107 (!) 111  Resp: (!) 23 (!) 24 (!) 31 (!) 28  Temp:      TempSrc:      SpO2: 97% 98% 98% 97%  Weight:      Height:       Eyes: PERRL, lids and conjunctivae normal ENMT: Mucous membranes are moist. Posterior pharynx clear of any exudate or lesions.Normal dentition.  Neck: normal, supple, no masses, no thyromegaly Respiratory: clear to auscultation bilaterally, no wheezing, no crackles. Normal respiratory effort. No accessory muscle use.  Cardiovascular: Regular rate and rhythm, no murmurs / rubs / gallops. No extremity edema.  2+ pedal pulses. No carotid bruits.  Abdomen: no tenderness, no masses palpated. No hepatosplenomegaly. Bowel sounds positive.  Musculoskeletal: no clubbing / cyanosis. No joint deformity upper and lower extremities. Good ROM, no contractures. Normal muscle tone.  Skin: no rashes, lesions, ulcers. No induration Neurologic: CN 2-12 grossly intact. Sensation intact, DTR normal. Strength 5/5 in all 4.  Psychiatric: Normal judgment and insight. Alert and oriented x 3. Normal mood.    Labs on Admission: I have personally reviewed following labs and imaging studies  CBC: Recent Labs  Lab 07/03/20 1022 07/07/20 1411  WBC 5.8 8.4  NEUTROABS  --  6.0  HGB 9.5* 10.0*  HCT 29.5* 31.0*  MCV 90.8 90.1  PLT 518* 315*   Basic Metabolic Panel: Recent Labs  Lab 07/03/20 1022 07/07/20 1411  NA 137 139  K 3.9 3.6  CL 97* 99  CO2 24 27  GLUCOSE 96 89  BUN 26* 12  CREATININE 9.88* 7.83*  CALCIUM 9.1 8.6*   GFR: Estimated Creatinine Clearance: 9.4 mL/min (A) (by C-G formula based on SCr of 7.83 mg/dL (H)). Liver Function Tests: Recent Labs  Lab 07/03/20 1022  AST 20  ALT 30  ALKPHOS 96  BILITOT 0.6  PROT 7.0  ALBUMIN 3.3*   Recent Labs  Lab 07/03/20 1022  LIPASE 32   No results for input(s): AMMONIA in the last 168 hours. Coagulation Profile: No results for input(s): INR, PROTIME in the last 168 hours. Cardiac Enzymes: No results for input(s): CKTOTAL, CKMB, CKMBINDEX, TROPONINI in the last 168 hours. BNP (last 3 results) No results for input(s): PROBNP in the last 8760 hours. HbA1C: No results for input(s): HGBA1C in the last 72 hours. CBG: No results for input(s): GLUCAP in the last 168 hours. Lipid Profile: No results for input(s): CHOL, HDL, LDLCALC, TRIG, CHOLHDL, LDLDIRECT in the last 72 hours. Thyroid Function Tests: No results for input(s): TSH, T4TOTAL, FREET4, T3FREE, THYROIDAB in the last 72 hours. Anemia Panel: No results for input(s): VITAMINB12, FOLATE,  FERRITIN, TIBC, IRON, RETICCTPCT in the last 72 hours. Urine analysis:    Component Value Date/Time   COLORURINE YELLOW 07/03/2020 1002   APPEARANCEUR CLEAR 07/03/2020 1002   LABSPEC 1.010 07/03/2020 1002   PHURINE 9.0 (H) 07/03/2020 1002   GLUCOSEU 50 (A) 07/03/2020 1002   HGBUR MODERATE (A) 07/03/2020 1002   BILIRUBINUR NEGATIVE 07/03/2020 1002   KETONESUR 20 (A) 07/03/2020 1002   PROTEINUR 100 (A) 07/03/2020 1002   NITRITE NEGATIVE 07/03/2020 1002   LEUKOCYTESUR NEGATIVE 07/03/2020 1002    Radiological Exams on Admission: CT Angio Chest PE W/Cm &/Or Wo Cm  Result Date: 07/07/2020 CLINICAL DATA:  Pneumonia 3 weeks ago shortness of breath EXAM: CT ANGIOGRAPHY CHEST WITH CONTRAST TECHNIQUE: Multidetector CT imaging of the chest was performed using the standard protocol during bolus administration of intravenous contrast.  Multiplanar CT image reconstructions and MIPs were obtained to evaluate the vascular anatomy. CONTRAST:  31mL OMNIPAQUE IOHEXOL 350 MG/ML SOLN COMPARISON:  None. FINDINGS: Cardiovascular: There is a optimal opacification of the pulmonary arteries. There is no central,segmental, or subsegmental filling defects within the pulmonary arteries. The heart is normal in size. A small pericardial effusion is seen. No evidence right heart strain. There is normal three-vessel brachiocephalic anatomy without proximal stenosis. The thoracic aorta is normal in appearance. Mediastinum/Nodes: No hilar, mediastinal, or axillary adenopathy. Thyroid gland, trachea, and esophagus demonstrate no significant findings. Lungs/Pleura: Extensive multifocal patchy ground-glass opacities are seen throughout both lungs. There is a small right and trace left pleural effusion present. Upper Abdomen: No acute abnormalities present in the visualized portions of the upper abdomen. Musculoskeletal: No chest wall abnormality. No acute or significant osseous findings. Mild S-shaped scoliotic curvature is noted.  Review of the MIP images confirms the above findings. IMPRESSION: No central, segmental, or subsegmental pulmonary embolism Extensive multifocal airspace opacities, consistent with multifocal pneumonia Small pericardial effusion Small right and trace left pleural effusion. Electronically Signed   By: Prudencio Pair M.D.   On: 07/07/2020 16:56   DG Chest Portable 1 View  Result Date: 07/07/2020 CLINICAL DATA:  Shortness of breath EXAM: PORTABLE CHEST 1 VIEW COMPARISON:  July 03, 2020 FINDINGS: There is airspace opacity in the lower lobe regions, slightly progressed. There is new infiltrate throughout portions of the left and right mid lung regions and portions of the right upper lobe. Heart size and pulmonary vascular normal. No adenopathy. No bone lesions. IMPRESSION: Multifocal pneumonia with progression of infiltrate in the right upper lobe as well as in the mid lung regions. Extensive infiltrate in each lower lobe persists without significant change from recent study. Question atypical organism pneumonia given this appearance. Check of COVID-19 status advised. Heart size normal.  No adenopathy evident. Electronically Signed   By: Lowella Grip III M.D.   On: 07/07/2020 15:08    EKG: Independently reviewed.  Sinus tachycardia  Assessment/Plan Active Problems:   PNA (pneumonia)  (please populate well all problems here in Problem List. (For example, if patient is on BP meds at home and you resume or decide to hold them, it is a problem that needs to be her. Same for CAD, COPD, HLD and so on)  CAP failed outpatient and inpatient treatment -Recent concern about resistant organisms given the patient has been on chronic immunosuppressant. -Agreed with vancomycin and cefepime -Sputum culture, atypical work-up Legionella and mycoplasma (lung exam surprisingly no crackles or wheezing) -Contact pulmonology, who will see the patient tomorrow morning. At this point, other atypical etiology such as fungal,  organizing pneumonia will be considered. -Other supportive care, breathing treatment double dose of prednisone.  Acute hypoxic respite failure -Secondary to multifocal pneumonia, management as above.  ESRD on HD -Completed HD yesterday, consult nephrology tomorrow for regular scheduled HD.  Kidney transplant partial rejection -Despite being on HD for 5 years, patient nephrologist to recommend patient continue to take immunosuppressant including tacrolimus and prednisone, she is on transplant list.  HTN -Amlodipine  History of CMV infection -Acyclovir daily  Anxiety depression -Continue SSRI.  DVT prophylaxis: Heparin subcu  code Status: Full Code Family Communication: Significant other at bedside Disposition Plan: Expect more than 2 midnight hospital stay, to wean off oxygen and determine the etiology of pneumonia. Consults called: Pulm Admission status: Telemetry admission  Lequita Halt MD [facility] Pager [phone]  07/07/2020, 7:23 PM

## 2020-07-08 ENCOUNTER — Encounter (HOSPITAL_COMMUNITY): Payer: Self-pay | Admitting: Internal Medicine

## 2020-07-08 DIAGNOSIS — N186 End stage renal disease: Secondary | ICD-10-CM

## 2020-07-08 DIAGNOSIS — J189 Pneumonia, unspecified organism: Secondary | ICD-10-CM | POA: Diagnosis not present

## 2020-07-08 LAB — PHOSPHORUS: Phosphorus: 5 mg/dL — ABNORMAL HIGH (ref 2.5–4.6)

## 2020-07-08 LAB — BASIC METABOLIC PANEL
Anion gap: 12 (ref 5–15)
BUN: 17 mg/dL (ref 6–20)
CO2: 26 mmol/L (ref 22–32)
Calcium: 8 mg/dL — ABNORMAL LOW (ref 8.9–10.3)
Chloride: 97 mmol/L — ABNORMAL LOW (ref 98–111)
Creatinine, Ser: 9.31 mg/dL — ABNORMAL HIGH (ref 0.44–1.00)
GFR, Estimated: 5 mL/min — ABNORMAL LOW (ref >60)
Glucose, Bld: 100 mg/dL — ABNORMAL HIGH (ref 70–99)
Potassium: 3.6 mmol/L (ref 3.5–5.1)
Sodium: 135 mmol/L (ref 135–145)

## 2020-07-08 LAB — CBC
HCT: 25.7 % — ABNORMAL LOW (ref 36.0–46.0)
Hemoglobin: 8.3 g/dL — ABNORMAL LOW (ref 12.0–15.0)
MCH: 29.6 pg (ref 26.0–34.0)
MCHC: 32.3 g/dL (ref 30.0–36.0)
MCV: 91.8 fL (ref 80.0–100.0)
Platelets: 410 10*3/uL — ABNORMAL HIGH (ref 150–400)
RBC: 2.8 MIL/uL — ABNORMAL LOW (ref 3.87–5.11)
RDW: 12 % (ref 11.5–15.5)
WBC: 7.2 10*3/uL (ref 4.0–10.5)
nRBC: 0 % (ref 0.0–0.2)

## 2020-07-08 LAB — STREP PNEUMONIAE URINARY ANTIGEN: Strep Pneumo Urinary Antigen: NEGATIVE

## 2020-07-08 LAB — PROCALCITONIN: Procalcitonin: 86.33 ng/mL

## 2020-07-08 LAB — IRON AND TIBC
Iron: 23 ug/dL — ABNORMAL LOW (ref 28–170)
Saturation Ratios: 17 % (ref 10.4–31.8)
TIBC: 134 ug/dL — ABNORMAL LOW (ref 250–450)
UIBC: 111 ug/dL

## 2020-07-08 LAB — HIV ANTIBODY (ROUTINE TESTING W REFLEX): HIV Screen 4th Generation wRfx: NONREACTIVE

## 2020-07-08 LAB — FERRITIN: Ferritin: 630 ng/mL — ABNORMAL HIGH (ref 11–307)

## 2020-07-08 MED ORDER — LIDOCAINE-PRILOCAINE 2.5-2.5 % EX CREA: 1.0000 "application " | TOPICAL_CREAM | CUTANEOUS | Status: DC | PRN

## 2020-07-08 MED ORDER — NEPRO/CARBSTEADY PO LIQD
237.0000 mL | Freq: Two times a day (BID) | ORAL | Status: DC
  Administered 2020-07-14: 237 mL via ORAL

## 2020-07-08 MED ORDER — PENTAFLUOROPROP-TETRAFLUOROETH EX AERO: 1.0000 "application " | INHALATION_SPRAY | CUTANEOUS | Status: DC | PRN

## 2020-07-08 MED ORDER — CHLORHEXIDINE GLUCONATE CLOTH 2 % EX PADS
6.0000 | MEDICATED_PAD | Freq: Every day | CUTANEOUS | Status: DC
  Administered 2020-07-09 – 2020-07-16 (×6): 6 via TOPICAL

## 2020-07-08 MED ORDER — HEPARIN SODIUM (PORCINE) 1000 UNIT/ML DIALYSIS: 1000.0000 [IU] | INTRAMUSCULAR | Status: DC | PRN

## 2020-07-08 MED ORDER — LIDOCAINE HCL (PF) 1 % IJ SOLN: 5.0000 mL | INTRAMUSCULAR | Status: DC | PRN

## 2020-07-08 MED ORDER — ACETAMINOPHEN 325 MG PO TABS
650.0000 mg | ORAL_TABLET | ORAL | Status: DC | PRN
  Administered 2020-07-09 – 2020-07-15 (×6): 650 mg via ORAL
  Filled 2020-07-08 (×5): qty 2

## 2020-07-08 MED ORDER — ORAL CARE MOUTH RINSE
15.0000 mL | Freq: Two times a day (BID) | OROMUCOSAL | Status: DC
  Administered 2020-07-09 – 2020-07-15 (×7): 15 mL via OROMUCOSAL

## 2020-07-08 MED ORDER — IPRATROPIUM-ALBUTEROL 0.5-2.5 (3) MG/3ML IN SOLN: 3.0000 mL | Freq: Four times a day (QID) | RESPIRATORY_TRACT | Status: DC | PRN

## 2020-07-08 MED ORDER — DIPHENHYDRAMINE HCL 25 MG PO CAPS
25.0000 mg | ORAL_CAPSULE | Freq: Once | ORAL | Status: AC
  Administered 2020-07-08: 25 mg via ORAL
  Filled 2020-07-08: qty 1

## 2020-07-08 MED ORDER — RISAQUAD PO CAPS
1.0000 | ORAL_CAPSULE | Freq: Every day | ORAL | Status: DC
  Administered 2020-07-10 – 2020-07-16 (×7): 1 via ORAL
  Filled 2020-07-08 (×8): qty 1

## 2020-07-08 MED ORDER — SODIUM CHLORIDE 0.9 % IV SOLN: 100.0000 mL | INTRAVENOUS | Status: DC | PRN

## 2020-07-08 MED ORDER — ALTEPLASE 2 MG IJ SOLR: 2.0000 mg | Freq: Once | INTRAMUSCULAR | Status: DC | PRN

## 2020-07-08 NOTE — ED Notes (Signed)
Attempted report x1. 

## 2020-07-08 NOTE — ED Notes (Signed)
Shift report received. Rounded on pt. Respirations even, unlabored. Cardiac monitor in place. Pt in NAD at this time.

## 2020-07-08 NOTE — Consult Note (Signed)
NAME:  Kaitlyn Schwartz, MRN:  [id number], DOB:  1985/10/07, LOS: 1 ADMISSION DATE:  07/07/2020, CONSULTATION DATE:  07/08/2020 REFERRING MD:  Dr. Darrick Meigs, CHIEF COMPLAINT:  Fever, SOB  Brief History:  82 yoF  immunosuppressed patient s/p partially failed renal transplant on ESRD, presenting with fever, hypoxia and ongoing multifocal PNA on chest CT.  Recent admit for same at [facility] and outpatient treatment for ongoing pneumonia.  Admitted by [facility].  Pulmonary consulted for further evaluation.   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    Recently [facility]mitted at [facility] 2/25- 2/28 and treated for CAP/ multifocal pneumonia, right greater than left.  Neg RVP panel.  Noted to be treated inpatient with ceftriaxone and azithro however patient unable to tolerate azithro secondary to nausea and was discontinued.  Discharged home on Augmentin.  Completed course however but then developed SOB again with nausea, vomiting, and diarrhea.  Evaluated in ER on 3/6 with CT abdomen concerning for possibly colitis (felt likely secondary to her recent abx use) and CXR still showing patchy bilateral airspace opacities.  Negative for COVID/ flu.  She was sent home on 10 course of doxycycline with plans to f/u with PCP.  However she was unable to tolerate doxycycline course secondary to nausea and took two day course.   Of note, has remained on iHD for the last 5 years given partial rejection and remained on immunosuppressant medications including tacrolimus and prednisone at the recommendations of her nephrologist.  She remains on the renal transplant list.  Presented back to the ER on 3/10, sent from her PCP office where she has  ongoing exertional dyspnea, fever of 101, and found to be hypoxic on room air at 75%.  She was unable to have a full iHD session on 3/9.   Reports some nasal congestion and rhinorrhea, productive cough with dark brown sputum, denies hemoptysis.   In ER, patient afebrile, hemodynamically stable and requiring 4L [place].  Labs noted for normal WBC, Hgb 10-> 8.3, sCr 7.83, K 3.6, normal lactic acid, neg troponin, neg COVID/ flu, CXR showing progression of right upper lobe infiltrate and unchanged extensive infiltrates in the lower lobes.  CTA PE was negative for PE but did show multifocal pneumonia.  She was admitted to [facility] and started on vancomycin and cefepime coverage.  Pulmonary consulted for further recommendations.   Symptoms began 3 weeks ago, dry cough, DOE, subjective fevers, malaise.  Worsening SOB over past 2 weeks.  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  Micro Data:  3/10 SARS/ flu >> neg 3/10 BCx >> neg 3/10 MRSA >>  Antimicrobials:  3/10 cefepime >> 3/10 vanc >>  Interim History / Subjective:  Consulted  Objective   Blood pressure 115/73, pulse 92, temperature 98.5 F (36.9 C), temperature source Oral, resp. rate (!) 26, height 5\' 2"  (1.575 m), weight 73.9 kg, last  menstrual period 06/29/2020, SpO2 95 %.        Intake/Output Summary (Last 24 hours) at 07/08/2020 0741 Last data filed at 07/07/2020 2111 Gross per 24 hour  Intake 400 ml  Output --  Net 400 ml   Filed Weights   07/07/20 1354  Weight: 73.9 kg    Examination: Constitutional: no acute distress  Eyes: EOMI, pupils equal Ears, nose, mouth, and throat: MMM, trachea midline Cardiovascular: RRR, ext warm Respiratory: mildly tachypneic, bilateral crackles Gastrointestinal: soft, +BS Skin: No rashes, normal turgor Neurologic: moves all 4 ext to command Psychiatric: RASS 0, excellent insight   Resolved Hospital Problem list    Assessment & Plan:   Multifocal PNA in the immunosuppressed patient: opportunistic vs. HCAP vs. Inflammatory vs. Drug-associated Acute on chronic hypoxic respiratory failure  - wean supplemental O2 for sat goal > 92% - aggressive pulmonary hygiene with IS - HIV (recent neg at [facility]), mycoplasma IgM, urine legionella and strep pending - Broad spectrum abx fine for now, bronch tomorrow AM, NPO after midnight  ESRD on MWF iHD S/p kidney transplant with partial rejection - fine from my standpoint to continue anti-rejection meds - iHD per nephrology  Hx CMV infection - daily acyclovir  Best practice (evaluated daily)  Diet: renal, NPO MN Pain/Anxiety/Delirium protocol (if indicated): n/a VAP protocol (if indicated): per primary DVT prophylaxis: : per primary GI prophylaxis: : per primary Glucose control: : per primary Mobility: : per primary Disposition:: per primary  Goals of Care:  n/a  Labs   CBC: Recent Labs  Lab 07/03/20 1022 07/07/20 1411 07/08/20 0205  WBC 5.8 8.4 7.2  NEUTROABS  --  6.0  --   HGB 9.5* 10.0* 8.3*  HCT 29.5* 31.0* 25.7*  MCV 90.8 90.1 91.8  PLT 518* 513* 410*    Basic Metabolic Panel: Recent Labs  Lab 07/03/20 1022 07/07/20 1411 07/08/20 0205  NA 137 139 135  K 3.9 3.6 3.6  CL 97* 99 97*  CO2 24 27 26    GLUCOSE 96 89 100*  BUN 26* 12 17  CREATININE 9.88* 7.83* 9.31*  CALCIUM 9.1 8.6* 8.0*   GFR: Estimated Creatinine Clearance: 7.9 mL/min (A) (by C-G formula based on SCr of 9.31 mg/dL (H)). Recent Labs  Lab 07/03/20 1022 07/07/20 1411 07/07/20 1459 07/08/20 0205  WBC 5.8 8.4  --  7.2  LATICACIDVEN  --   --  0.7  --     Liver Function Tests: Recent Labs  Lab 07/03/20 1022  AST 20  ALT 30  ALKPHOS 96  BILITOT 0.6  PROT 7.0  ALBUMIN 3.3*   Recent Labs  Lab 07/03/20 1022  LIPASE 32   No results for input(s): AMMONIA in the last 168 hours.  ABG No results found for: PHART, PCO2ART, PO2ART, HCO3, TCO2, ACIDBASEDEF, O2SAT   Coagulation Profile: No results for input(s): INR, PROTIME in the last 168 hours.  Cardiac Enzymes: No results for input(s): CKTOTAL, CKMB, CKMBINDEX, TROPONINI in the last 168 hours.  HbA1C: No results found for: HGBA1C  CBG: No results for input(s): GLUCAP in the last 168 hours.  Review of Systems:    Positive Symptoms in bold:  Constitutional fevers, chills, weight loss, fatigue, anorexia, malaise  Eyes decreased vision,  double vision, eye irritation  Ears, Nose, Mouth, Throat sore throat, trouble swallowing, sinus congestion  Cardiovascular chest pain, paroxysmal nocturnal dyspnea, lower ext edema, palpitations   Respiratory SOB, cough, DOE, hemoptysis, wheezing  Gastrointestinal nausea, vomiting, diarrhea  Genitourinary burning with urination, trouble urinating  Musculoskeletal joint aches, joint swelling, back pain  Integumentary  rashes, skin lesions  Neurological focal weakness, focal numbness, trouble speaking, headaches  Psychiatric depression, anxiety, confusion  Endocrine polyuria, polydipsia, cold intolerance, heat intolerance  Hematologic abnormal bruising, abnormal bleeding, unexplained nose bleeds  Allergic/Immunologic recurrent infections, hives, swollen lymph nodes     Past Medical History:  She,  has a past  medical history of Blood transfusion without reported diagnosis, Cancer ([place]), Neurofibromatosis ([place]), and Renal disorder.   Surgical History:   Past Surgical History:  Procedure Laterality Date  . KIDNEY TRANSPLANT    . throat biopsy    . TONSILLECTOMY       Social History:   reports that she has never smoked. She has never used smokeless tobacco. She reports previous alcohol use. She reports that she does not use drugs.   Family History:  Her family history includes Cancer in her father and mother; Hypertension in her father and mother.   Allergies Allergies  Allergen Reactions  . Lisinopril Shortness Of Breath    Other reaction(s): Cough   . Vancomycin Itching  . Versed [Midazolam] Anxiety  . Azithromycin     IV only  . Cellcept [Mycophenolate] Diarrhea  . Dobutamine     Other reaction(s): HTN, bradycardia  . Ibuprofen     Other reaction(s): *Unknown Pt was told not to take ibuprofen after kidney transplant - Sep 10, 2003     Home Medications  Prior to Admission medications   Medication Sig Start Date End Date Taking? Authorizing Provider  acetaminophen (TYLENOL) 500 MG tablet Take 1,000 mg by mouth every 8 (eight) hours as needed for fever or moderate pain.   Yes [provider]  acyclovir (ZOVIRAX) 400 MG tablet Take 400 mg by mouth daily. 08/18/16  Yes [provider]  albuterol (VENTOLIN HFA) 108 (90 Base) MCG/ACT inhaler Inhale 2 puffs into the lungs every 4 (four) hours as needed for wheezing. 08/07/18 07/11/20 Yes [provider]  amLODipine (NORVASC) 10 MG tablet Take 10 mg by mouth daily. 05/09/20  Yes [provider]  buPROPion (WELLBUTRIN) 100 MG tablet Take 100 mg by mouth daily.   Yes [provider]  calcium carbonate (OS-CAL) 1250 (500 Ca) MG chewable tablet Chew 2 tablets by mouth daily as needed for heartburn.   Yes [provider]  diphenhydrAMINE (BENADRYL) 25 MG tablet Take 25 mg by mouth daily as  needed for itching or allergies.   Yes [provider]  doxycycline (VIBRAMYCIN) 100 MG capsule Take 1 capsule (100 mg total) by mouth 2 (two) times daily for 10 days. Patient taking differently: Take 100 mg by mouth 2 (two) times daily. Start date : 06/22/20 . Stopped for 1 week in between 07/03/20 07/13/20 Yes Rayna Sexton, PA-C  escitalopram (LEXAPRO) 20 MG tablet Take 20 mg by mouth daily. 06/29/20  Yes [provider]  famotidine (PEPCID) 10 MG tablet Take 10 mg by mouth daily. 01/27/19  Yes [provider]  gabapentin (NEURONTIN) 300 MG capsule Take 300 mg by mouth See admin instructions. Takes only on Dialysis days ( Monday, Wednesday ana Friday)   Yes [provider]  Lactobacillus Rhamnosus, GG, (CULTURELLE) CAPS Take 1 capsule by  mouth daily at 6 (six) AM.   Yes [provider]  losartan (COZAAR) 50 MG tablet Take 50 mg by mouth daily. 01/19/18  Yes [provider]  melatonin 5 MG TABS Take 5 mg by mouth at bedtime.   Yes [provider]  methylphenidate 18 MG PO CR tablet Take 18 mg by mouth daily. Takes only Monday through Friday. 03/23/20  Yes [provider]  multivitamin (RENA-VIT) TABS tablet Take 1 tablet by mouth daily.   Yes [provider]  norethindrone (AYGESTIN) 5 MG tablet Take 5 mg by mouth daily. 07/03/20  Yes [provider]  ondansetron (ZOFRAN) 4 MG tablet Take 4 mg by mouth every 8 (eight) hours as needed for nausea or vomiting. 06/24/20  Yes [provider]  predniSONE (DELTASONE) 5 MG tablet Take 5 mg by mouth daily.   Yes [provider]  sevelamer (RENAGEL) 800 MG tablet Take 1,600 mg by mouth 3 (three) times daily. 05/18/20  Yes [provider]  simvastatin (ZOCOR) 10 MG tablet Take 10 mg by mouth daily. 05/09/20  Yes [provider]  sirolimus (RAPAMUNE) 1 MG tablet Take 2 mg by mouth daily.   Yes [provider]  SUMAtriptan (IMITREX) 50  MG tablet Take 50 mg by mouth daily as needed for migraine.   Yes [provider]

## 2020-07-08 NOTE — Progress Notes (Signed)
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  Patient.  Pneumonia on 06/24/2020 and went to [facility] ED at that time she had high fever of 103F, O2 sats 92% room air, WBC 16,000, COVID-19 was negative.  Chest x-ray showed bilateral multifocal right more than left pneumonia.  She was started on ceftriaxone and Zithromax, Zithromax was discontinued due to poor tolerance, gradually symptoms improved and she was sent home on p.o. Augmentin.  Patient completed Augmentin 9 days ago.  However patient symptoms of cough and shortness of breath return.  She was prescribed doxycycline by her PCP but she could not tolerate doxycycline so she came back to the hospital. In the ED she was found to have O2 sats of 75% on room air.  Patient started on vancomycin and cefepime.  PCCM consulted.    Subjective   Patient seen and examined, still requiring 3 to 4 L/min of oxygen via nasal cannula.  Denies chest pain or shortness of breath.   Assessment/Plan:     1. Acute hypoxemic respiratory failure-patient failed outpatient treatment for community-acquired pneumonia.  Concern for growth of resistant organism as patient is on chronic venous pressor therapy.  Patient started on vancomycin and cefepime.  Follow-up urine for Legionella, sputum culture.  PCCM has been consulted, and plan for bronchoscopy in a.m.  2. Multifocal pneumonia-seen on chest x-ray, failed outpatient treatment.  Started on antibiotics as above.  Plan for bronchoscopy in a.m.  PCCM following.  3. ESRD on hemodialysis-patient is on hemodialysis Monday Wednesday Friday, nephrology consulted for  hemodialysis.  Continue sevelamer  4. S/p renal transplant-partial rejection-despite being on hemodialysis for 5 years, nephrologist recommended patient continue to take her immunosuppressants including tacrolimus, prednisone.  Patient is on transplant list for getting another kidney transplant.  5. Hypertension-blood pressure stable, continue amlodipine, losartan.  6. History of CMV infection-continue acyclovir.     COVID-19 Labs  No results for input(s): DDIMER, FERRITIN, LDH, CRP in the last 72 hours.  Lab Results  Component Value Date   [place] NEGATIVE 07/07/2020     Scheduled medications:   . acyclovir  400 mg Oral Daily  . amLODipine  2.5 mg Oral Daily  . benzonatate  200 mg Oral BID  . buPROPion  100 mg Oral Daily  . [START ON 07/09/2020] Chlorhexidine Gluconate Cloth  6 each Topical Q0600  . escitalopram  20 mg Oral Daily  . guaiFENesin  1,200 mg Oral BID  . heparin  5,000 Units Subcutaneous Q12H  . lactobacillus acidophilus  2 tablet Oral Daily  . losartan  50 mg Oral Daily  . predniSONE  5 mg Oral Q breakfast  . sevelamer carbonate  1,600 mg Oral TID WC  . simvastatin  10 mg Oral q1800  . Sirolimus  2 mg Oral Daily  . sodium bicarbonate  1,300 mg Oral Daily         CBG: No results for input(s): GLUCAP in the last 168 hours.  SpO2: 93 % O2 Flow Rate (L/min): 10 L/min    CBC: Recent Labs  Lab 07/03/20 1022 07/07/20 1411 07/08/20 0205  WBC 5.8 8.4 7.2  NEUTROABS  --  6.0  --   HGB 9.5* 10.0* 8.3*  HCT 29.5* 31.0* 25.7*  MCV 90.8 90.1 91.8  PLT 518* 513* 410*    Basic Metabolic Panel: Recent Labs  Lab 07/03/20 1022 07/07/20 1411 07/08/20 0205  NA 137 139 135  K 3.9 3.6 3.6  CL 97* 99 97*  CO2 24 27 26   GLUCOSE 96 89 100*  BUN 26* 12 17  CREATININE 9.88* 7.83* 9.31*  CALCIUM 9.1 8.6* 8.0*     Liver Function Tests: Recent Labs  Lab 07/03/20 1022  AST 20  ALT 30  ALKPHOS 96  BILITOT 0.6  PROT 7.0  ALBUMIN 3.3*      Antibiotics: Anti-infectives (From admission, onward)   Start     Dose/Rate Route Frequency Ordered Stop   07/08/20 2000  ceFEPIme (MAXIPIME) 1 g in sodium chloride 0.9 % 100 mL IVPB        1 g 200 mL/hr over 30 Minutes Intravenous Every 24 hours 07/07/20 1921     07/08/20 1200  vancomycin (VANCOCIN) IVPB 750 mg/150 ml premix        750 mg 150 mL/hr over 60 Minutes Intravenous Every M-W-F (Hemodialysis) 07/07/20 1801     07/07/20 1930  acyclovir (ZOVIRAX) tablet 400 mg        400 mg Oral Daily 07/07/20 1921     07/07/20 1830  vancomycin (VANCOREADY) IVPB 1500 mg/300 mL        1,500 mg 150 mL/hr over 120 Minutes Intravenous  Once 07/07/20 1756 07/07/20 2041   07/07/20 1745  ceFEPIme (MAXIPIME) 1 g in sodium chloride 0.9 % 100 mL IVPB        1 g 200 mL/hr over 30 Minutes Intravenous  Once 07/07/20 1740 07/07/20 2111       DVT prophylaxis: Heparin  Code Status: Full code  Family Communication: No family at bedside   Consultants:  PCCM  Procedures:      Objective   Vitals:   07/08/20 0744 07/08/20 1100 07/08/20 1300 07/08/20 1400  BP: 110/74 129/77 128/81 126/81  Pulse: 99 (!) 101 (!) 105 100  Resp: (!) 23 (!) 24 (!) 24 (!) 34  Temp: 99.4 F (37.4 C)     TempSrc: Oral     SpO2: 98% 92% 95% 93%  Weight:      Height:        Intake/Output Summary (Last 24 hours) at 07/08/2020 1509 Last data filed at 07/08/2020 1425 Gross per 24 hour  Intake 550 ml  Output --  Net 550 ml    03/09 1901 - 03/11 0700 In: 400  Out: -   Filed Weights   07/07/20 1354  Weight: 73.9 kg    Physical Examination:   General-appears in no acute distress Heart-S1-S2, regular, no murmur auscultated Lungs-bilateral crackles auscultated at lung bases Abdomen-soft, nontender, no organomegaly Extremities-no edema in the lower extremities Neuro-alert, oriented x3, no focal deficit noted  Status is: Inpatient  Dispo: The patient is from: Home              Anticipated d/c is  to: Home              Anticipated d/c date is: 07/12/2020              Patient currently not stable for discharge  Barrier to discharge-treatment for multifocal pneumonia            Data Reviewed:   Recent Results (from the past 240 hour(s))  Blood culture (routine x 2)     Status: None (  Preliminary result)   Collection Time: 07/07/20  2:17 PM   Specimen: BLOOD  Result Value Ref Range Status   Specimen Description BLOOD RIGHT ANTECUBITAL  Final   Special Requests   Final    BOTTLES DRAWN AEROBIC AND ANAEROBIC Blood Culture results may not be optimal due to an excessive volume of blood received in culture bottles   Culture   Final    NO GROWTH < 24 HOURS Performed at [facility] [address], [place], [place] [address]    Report Status PENDING  Incomplete  Resp Panel by RT-PCR (Flu A&B, Covid) Nasopharyngeal Swab     Status: None   Collection Time: 07/07/20  2:34 PM   Specimen: Nasopharyngeal Swab; Nasopharyngeal(NP) swabs in vial transport medium  Result Value Ref Range Status   SARS Coronavirus 2 by RT PCR NEGATIVE NEGATIVE Final    Comment: (NOTE) SARS-CoV-2 target nucleic acids are NOT DETECTED.  The SARS-CoV-2 RNA is generally detectable in upper respiratory specimens during the acute phase of infection. The lowest concentration of SARS-CoV-2 viral copies this assay can detect is 138 copies/mL. A negative result does not preclude SARS-Cov-2 infection and should not be used as the sole basis for treatment or other patient management decisions. A negative result may occur with  improper specimen collection/handling, submission of specimen other than nasopharyngeal swab, presence of viral mutation(s) within the areas targeted by this assay, and inadequate number of viral copies(<138 copies/mL). A negative result must be combined with clinical observations, patient history, and epidemiological information. The expected result is Negative.  Fact Sheet for  Patients:  EntrepreneurPulse.com.au  Fact Sheet for Healthcare Providers:  IncredibleEmployment.be  This test is no t yet approved or cleared by the [place] FDA and  has been authorized for detection and/or diagnosis of SARS-CoV-2 by FDA under an Emergency Use Authorization (EUA). This EUA will remain  in effect (meaning this test can be used) for the duration of the COVID-19 declaration under Section 564(b)(1) of the Act, 21 U.S.C.section 360bbb-3(b)(1), unless the authorization is terminated  or revoked sooner.       Influenza A by PCR NEGATIVE NEGATIVE Final   Influenza B by PCR NEGATIVE NEGATIVE Final    Comment: (NOTE) The Xpert Xpress SARS-CoV-2/FLU/RSV plus assay is intended as an aid in the diagnosis of influenza from Nasopharyngeal swab specimens and should not be used as a sole basis for treatment. Nasal washings and aspirates are unacceptable for Xpert Xpress SARS-CoV-2/FLU/RSV testing.  Fact Sheet for Patients: EntrepreneurPulse.com.au  Fact Sheet for Healthcare Providers: IncredibleEmployment.be  This test is not yet approved or cleared by the [place] FDA and has been authorized for detection and/or diagnosis of SARS-CoV-2 by FDA under an Emergency Use Authorization (EUA). This EUA will remain in effect (meaning this test can be used) for the duration of the COVID-19 declaration under Section 564(b)(1) of the Act, 21 U.S.C. section 360bbb-3(b)(1), unless the authorization is terminated or revoked.  Performed at [facility], [place] [address] [place], [place] [address]   MRSA PCR Screening     Status: None   Collection Time: 07/07/20 10:12 PM   Specimen: Nasal Mucosa; Nasopharyngeal  Result Value Ref Range Status   MRSA by PCR NEGATIVE NEGATIVE Final    Comment:        The GeneXpert MRSA Assay (FDA approved for NASAL specimens only), is one component of a comprehensive  MRSA colonization surveillance program. It is not intended to diagnose MRSA infection  nor to guide or monitor treatment for MRSA infections. Performed at [facility], [place] [address], [place],  [address]     Recent Labs  Lab 07/03/20 1022  LIPASE 32   No results for input(s): AMMONIA in the last 168 hours.  Cardiac Enzymes: No results for input(s): CKTOTAL, CKMB, CKMBINDEX, TROPONINI in the last 168 hours. BNP (last 3 results) No results for input(s): BNP in the last 8760 hours.  ProBNP (last 3 results) No results for input(s): PROBNP in the last 8760 hours.  Studies:  CT Angio Chest PE W/Cm &/Or Wo Cm  Result Date: 07/07/2020 CLINICAL DATA:  Pneumonia 3 weeks ago shortness of breath EXAM: CT ANGIOGRAPHY CHEST WITH CONTRAST TECHNIQUE: Multidetector CT imaging of the chest was performed using the standard protocol during bolus administration of intravenous contrast. Multiplanar CT image reconstructions and MIPs were obtained to evaluate the vascular anatomy. CONTRAST:  27mL OMNIPAQUE IOHEXOL 350 MG/ML SOLN COMPARISON:  None. FINDINGS: Cardiovascular: There is a optimal opacification of the pulmonary arteries. There is no central,segmental, or subsegmental filling defects within the pulmonary arteries. The heart is normal in size. A small pericardial effusion is seen. No evidence right heart strain. There is normal three-vessel brachiocephalic anatomy without proximal stenosis. The thoracic aorta is normal in appearance. Mediastinum/Nodes: No hilar, mediastinal, or axillary adenopathy. Thyroid gland, trachea, and esophagus demonstrate no significant findings. Lungs/Pleura: Extensive multifocal patchy ground-glass opacities are seen throughout both lungs. There is a small right and trace left pleural effusion present. Upper Abdomen: No acute abnormalities present in the visualized portions of the upper abdomen. Musculoskeletal: No chest wall abnormality. No acute or significant  osseous findings. Mild S-shaped scoliotic curvature is noted. Review of the MIP images confirms the above findings. IMPRESSION: No central, segmental, or subsegmental pulmonary embolism Extensive multifocal airspace opacities, consistent with multifocal pneumonia Small pericardial effusion Small right and trace left pleural effusion. Electronically Signed   By: Prudencio Pair M.D.   On: 07/07/2020 16:56   DG Chest Portable 1 View  Result Date: 07/07/2020 CLINICAL DATA:  Shortness of breath EXAM: PORTABLE CHEST 1 VIEW COMPARISON:  July 03, 2020 FINDINGS: There is airspace opacity in the lower lobe regions, slightly progressed. There is new infiltrate throughout portions of the left and right mid lung regions and portions of the right upper lobe. Heart size and pulmonary vascular normal. No adenopathy. No bone lesions. IMPRESSION: Multifocal pneumonia with progression of infiltrate in the right upper lobe as well as in the mid lung regions. Extensive infiltrate in each lower lobe persists without significant change from recent study. Question atypical organism pneumonia given this appearance. Check of COVID-19 status advised. Heart size normal.  No adenopathy evident. Electronically Signed   By: Lowella Grip III M.D.   On: 07/07/2020 15:08       [place]   [facility] If 7PM-7AM, please contact night-coverage at [email or web address], Office  [phone]   07/08/2020, 3:09 PM  LOS: 1 day

## 2020-07-08 NOTE — Consult Note (Addendum)
ESRD Consult Note  Requesting provider: Eleonore Chiquito Service requesting consult: Hospitalist Reason for consult: ESRD, provision of dialysis Indication for acute dialysis?: End Stage Renal Disease  Outpatient dialysis unit: [facility] Outpatient dialysis schedule: MWF  Assessment/Recommendations: Kaitlyn Schwartz is a/an 35 y.o. female with a past medical history notable for ESRD on HD admitted with multifocal pneumonia.   # ESRD: Will need to obtain outpatient prescription. Plan for dialysis today with following treatment. 3hrs, 3K, 2.5Ca, Na 137, Dialyzer: F180 # Volume/ hypertension: not sure EDW, maintain even as patient feels dehydrated. BP acceptable.  Can continue norvasc. Hold losartan for now # Anemia of Chronic Kidney Disease: Hemoglobin 8.3. Will obtain iron studies but delay treatment given active infection. Consider ESA after obtaining outpt records # Secondary Hyperparathyroidism/Hyperphosphatemia: Add on phosphorus.  Continue home sevelamer 1600 mg 3 times daily # Vascular access: Left upper extremity aVF with no issues #Multifocal pneumonia: Being treated with cefepime and vancomycin.  Plans for bronchoscopy tomorrow #History of kidney transplant: Now on dialysis but continue home sirolimus 2 mg daily and prednisone 5 mg daily  # Additional recommendations: - Dose all meds for creatinine clearance < 10 ml/min  - Unless absolutely necessary, no MRIs with gadolinium.  - Implement save arm precautions.  Prefer needle sticks in the dorsum of the hands or wrists.  No blood pressure measurements in arm. - If blood transfusion is requested during hemodialysis sessions, please alert [place] prior to the session.   Recommendations were discussed with the primary team.   History of Present Illness: Kaitlyn Schwartz is a/an 35 y.o. female with a past medical history of ESRD who presents with shortness of breath.  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  Continues to make some urine.  Now on dialysis  Monday Wednesday Friday at [facility].  She has continued on low-dose immunosuppression to maintain kidney function and in hopes of future transplant.  Also has a history of hypertension and hyperlipidemia.  Patient was admitted to [facility] in late February with multifocal pneumonia she was treated with antibiotics and discharged.  Despite treatment she continued to have symptoms with worsening shortness of breath, fever, hypoxia.  She also developed nausea, vomiting, diarrhea.  She went to the emergency department on 3/6 for abdominal pain.  CT was obtained that was concerning for possible colitis as well as persistent patchy opacities.  She was Covid and flu negative at that time.  She was discharged with a 10-day course of doxycycline.  The doxycycline caused significant nausea and limited her ability to take the medication consistently.  She presented to the emergency department again after she went to her PCP office where she was noted to be hypoxic and febrile to 101 Fahrenheit.  She last had dialysis on Wednesday.  She has been tolerating dialysis well and does not feel like she has significant volume.  She states if anything she probably is dehydrated.  In the emergency department chest x-ray demonstrated persistent patchy opacities.  CTA was obtained and negative for PE but also showed multifocal pneumonia.  She was evaluated by pulmonology who plans to do bronchoscopy tomorrow.  Medications:  Current Facility-Administered Medications  Medication Dose Route Frequency Provider Last Rate Last Admin  . acyclovir (ZOVIRAX) tablet 400 mg  400 mg Oral Daily Wynetta Fines T, MD   400 mg at 07/08/20 1305  . amLODipine (NORVASC) tablet 2.5 mg  2.5 mg Oral Daily Wynetta Fines T, MD      . benzonatate (TESSALON) capsule 200 mg  200 mg Oral BID Wynetta Fines T, MD   200 mg at 07/08/20 1255  .  buPROPion [facility]) tablet 100 mg  100 mg Oral Daily Wynetta Fines T, MD   100 mg at 07/08/20 1304  . ceFEPIme (MAXIPIME) 1 g in sodium chloride 0.9 % 100 mL IVPB  1 g Intravenous Q24H Duanne Limerick, RPH      . [START ON 07/09/2020] Chlorhexidine Gluconate Cloth 2 % PADS 6 each  6 each Topical Q0600 Reesa Chew, MD      . escitalopram (LEXAPRO) tablet 20 mg  20 mg Oral Daily Wynetta Fines T, MD   20 mg at 07/08/20 1256  . guaiFENesin (MUCINEX) 12 hr tablet 1,200 mg  1,200 mg Oral BID Wynetta Fines T, MD   1,200 mg at 07/08/20 1256  . heparin injection 5,000 Units  5,000 Units Subcutaneous Q12H Lequita Halt, MD   5,000 Units at 07/08/20 1305  . ipratropium-albuterol (DUONEB) 0.5-2.5 (3) MG/3ML nebulizer solution 3 mL  3 mL Nebulization Q6H PRN Oswald Hillock, MD      . lactobacillus acidophilus (BACID) tablet 2 tablet  2 tablet Oral Daily Lequita Halt, MD   2 tablet at 07/08/20 1259  . losartan (COZAAR) tablet 50 mg  50 mg Oral Daily Wynetta Fines T, MD   50 mg at 07/08/20 1256  . predniSONE (DELTASONE) tablet 5 mg  5 mg Oral Q breakfast Wynetta Fines T, MD      . sevelamer carbonate (RENVELA) tablet 1,600 mg  1,600 mg Oral TID WC Wynetta Fines T, MD   1,600 mg at 07/08/20 1255  . simvastatin (ZOCOR) tablet 10 mg  10 mg Oral q1800 Wynetta Fines T, MD      . sirolimus (RAPAMUNE) tablet 2 mg  2 mg Oral Daily Wynetta Fines T, MD   2 mg at 07/08/20 1255  . sodium bicarbonate tablet 1,300 mg  1,300 mg Oral Daily Wynetta Fines T, MD   1,300 mg at 07/08/20 1301  . vancomycin (VANCOCIN) IVPB 750 mg/150 ml premix  750 mg Intravenous Q M,W,F-HD Tegeler, Gwenyth Allegra, MD   Stopped at 07/08/20 1425   Current Outpatient Medications  Medication Sig Dispense Refill  . acetaminophen (TYLENOL) 500 MG tablet Take 1,000 mg by mouth every 8 (eight) hours as needed for fever or moderate pain.    Marland Kitchen acyclovir (ZOVIRAX) 400 MG tablet Take 400 mg by mouth daily.    Marland Kitchen albuterol (VENTOLIN HFA) 108 (90 Base) MCG/ACT inhaler Inhale 2  puffs into the lungs every 4 (four) hours as needed for wheezing.    Marland Kitchen amLODipine (NORVASC) 10 MG tablet Take 10 mg by mouth daily.    Marland Kitchen buPROPion (WELLBUTRIN) 100 MG tablet Take 100 mg by mouth daily.    . calcium carbonate (OS-CAL) 1250 (500 Ca) MG chewable tablet Chew 2 tablets by mouth daily as needed for heartburn.    . diphenhydrAMINE (BENADRYL) 25 MG tablet Take 25 mg by mouth daily as needed for itching or allergies.    Marland Kitchen doxycycline (VIBRAMYCIN) 100 MG capsule Take 1 capsule (100 mg total) by mouth 2 (two) times daily for 10 days. (Patient taking differently: Take 100 mg by mouth 2 (two) times daily. Start date : 06/22/20 . Stopped for 1 week in between) 20 capsule 0  . escitalopram (LEXAPRO) 20 MG tablet Take 20 mg by  mouth daily.    . famotidine (PEPCID) 10 MG tablet Take 10 mg by mouth daily.    Marland Kitchen gabapentin (NEURONTIN) 300 MG capsule Take 300 mg by mouth See admin instructions. Takes only on Dialysis days ( Monday, Wednesday ana Friday)    . Lactobacillus Rhamnosus, GG, (CULTURELLE) CAPS Take 1 capsule by mouth daily at 6 (six) AM.    . losartan (COZAAR) 50 MG tablet Take 50 mg by mouth daily.    . melatonin 5 MG TABS Take 5 mg by mouth at bedtime.    . methylphenidate 18 MG PO CR tablet Take 18 mg by mouth daily. Takes only Monday through Friday.    . multivitamin (RENA-VIT) TABS tablet Take 1 tablet by mouth daily.    . norethindrone (AYGESTIN) 5 MG tablet Take 5 mg by mouth daily.    . ondansetron (ZOFRAN) 4 MG tablet Take 4 mg by mouth every 8 (eight) hours as needed for nausea or vomiting.    . predniSONE (DELTASONE) 5 MG tablet Take 5 mg by mouth daily.    . sevelamer (RENAGEL) 800 MG tablet Take 1,600 mg by mouth 3 (three) times daily.    . simvastatin (ZOCOR) 10 MG tablet Take 10 mg by mouth daily.    . sirolimus (RAPAMUNE) 1 MG tablet Take 2 mg by mouth daily.    . SUMAtriptan (IMITREX) 50 MG tablet Take 50 mg by mouth daily as needed for migraine.        ALLERGIES Lisinopril, Vancomycin, Versed [midazolam], Azithromycin, Cellcept [mycophenolate], Dobutamine, and Ibuprofen  MEDICAL HISTORY Past Medical History:  Diagnosis Date  . Blood transfusion without reported diagnosis   . Cancer ([place])   . Neurofibromatosis ([place])   . Renal disorder      SOCIAL HISTORY Social History   Socioeconomic History  . Marital status: Married    Spouse name: Not on file  . Number of children: Not on file  . Years of education: Not on file  . Highest education level: Not on file  Occupational History  . Not on file  Tobacco Use  . Smoking status: Never Smoker  . Smokeless tobacco: Never Used  Vaping Use  . Vaping Use: Never used  Substance and Sexual Activity  . Alcohol use: Not Currently  . Drug use: Never  . Sexual activity: Not on file  Other Topics Concern  . Not on file  Social History Narrative  . Not on file   Social Determinants of Health   Financial Resource Strain: Not on file  Food Insecurity: Not on file  Transportation Needs: Not on file  Physical Activity: Not on file  Stress: Not on file  Social Connections: Not on file  Intimate Partner Violence: Not on file     FAMILY HISTORY Family History  Problem Relation Age of Onset  . Cancer Mother   . Hypertension Mother   . Cancer Father   . Hypertension Father      Review of Systems: 12 systems were reviewed and negative except per HPI  Physical Exam: Vitals:   07/08/20 1300 07/08/20 1400  BP: 128/81 126/81  Pulse: (!) 105 100  Resp: (!) 24 (!) 34  Temp:    SpO2: 95% 93%   Total I/O In: 150 [IV Piggyback:150] Out: -   Intake/Output Summary (Last 24 hours) at 07/08/2020 1509 Last data filed at 07/08/2020 1425 Gross per 24 hour  Intake 550 ml  Output -  Net 550 ml   General: well-appearing, no  acute distress HEENT: anicteric sclera, MMM CV: Tachycardia, no murmur, no significant edema in the bilateral lower extremities Lungs: Coarse bilateral breath  sounds, mild increased work of breathing, Abd: soft, non-tender, non-distended Skin: no visible lesions or rashes Psych: alert, engaged, appropriate mood and affect Neuro: normal speech, no gross focal deficits   Test Results Reviewed Lab Results  Component Value Date   NA 135 07/08/2020   K 3.6 07/08/2020   CL 97 (L) 07/08/2020   CO2 26 07/08/2020   BUN 17 07/08/2020   CREATININE 9.31 (H) 07/08/2020   CALCIUM 8.0 (L) 07/08/2020   ALBUMIN 3.3 (L) 07/03/2020    I have reviewed relevant outside healthcare records

## 2020-07-08 NOTE — Progress Notes (Signed)
Pt down to dialysis via bed accompanied by HD tech.

## 2020-07-08 NOTE — ED Notes (Signed)
Pt transferred into a hospital bed.

## 2020-07-08 NOTE — ED Notes (Signed)
Lunch Tray Ordered @ 1001. 

## 2020-07-09 ENCOUNTER — Encounter (HOSPITAL_COMMUNITY): Payer: Self-pay | Admitting: Internal Medicine

## 2020-07-09 ENCOUNTER — Inpatient Hospital Stay (HOSPITAL_COMMUNITY): Payer: Managed Care, Other (non HMO) | Admitting: Anesthesiology

## 2020-07-09 ENCOUNTER — Inpatient Hospital Stay (HOSPITAL_COMMUNITY): Payer: Managed Care, Other (non HMO)

## 2020-07-09 ENCOUNTER — Encounter (HOSPITAL_COMMUNITY)
Admission: EM | Disposition: A | Discharge status: Home / Self Care | Payer: Self-pay | Source: Home / Self Care | Attending: Internal Medicine

## 2020-07-09 DIAGNOSIS — J9601 Acute respiratory failure with hypoxia: Secondary | ICD-10-CM

## 2020-07-09 DIAGNOSIS — J189 Pneumonia, unspecified organism: Secondary | ICD-10-CM | POA: Diagnosis not present

## 2020-07-09 HISTORY — PX: BRONCHIAL BRUSHINGS: SHX5108

## 2020-07-09 HISTORY — PX: VIDEO BRONCHOSCOPY: SHX5072

## 2020-07-09 HISTORY — PX: BRONCHIAL WASHINGS: SHX5105

## 2020-07-09 LAB — BODY FLUID CELL COUNT WITH DIFFERENTIAL
Eos, Fluid: 1 %
Eos, Fluid: 4 %
Lymphs, Fluid: 69 %
Lymphs, Fluid: 78 %
Monocyte-Macrophage-Serous Fluid: 15 % — ABNORMAL LOW (ref 50–90)
Monocyte-Macrophage-Serous Fluid: 21 % — ABNORMAL LOW (ref 50–90)
Neutrophil Count, Fluid: 6 % (ref 0–25)
Neutrophil Count, Fluid: 6 % (ref 0–25)
Total Nucleated Cell Count, Fluid: 255 cu mm (ref 0–1000)
Total Nucleated Cell Count, Fluid: 560 cu mm (ref 0–1000)

## 2020-07-09 LAB — GLUCOSE, CAPILLARY
Glucose-Capillary: 90 mg/dL (ref 70–99)
Glucose-Capillary: 167 mg/dL — ABNORMAL HIGH (ref 70–99)
Glucose-Capillary: 199 mg/dL — ABNORMAL HIGH (ref 70–99)

## 2020-07-09 LAB — BASIC METABOLIC PANEL
Anion gap: 10 (ref 5–15)
BUN: 5 mg/dL — ABNORMAL LOW (ref 6–20)
CO2: 29 mmol/L (ref 22–32)
Calcium: 9.1 mg/dL (ref 8.9–10.3)
Chloride: 99 mmol/L (ref 98–111)
Creatinine, Ser: 4.47 mg/dL — ABNORMAL HIGH (ref 0.44–1.00)
GFR, Estimated: 12 mL/min — ABNORMAL LOW (ref >60)
Glucose, Bld: 84 mg/dL (ref 70–99)
Potassium: 4 mmol/L (ref 3.5–5.1)
Sodium: 138 mmol/L (ref 135–145)

## 2020-07-09 LAB — CBC
HCT: 26.7 % — ABNORMAL LOW (ref 36.0–46.0)
Hemoglobin: 8.6 g/dL — ABNORMAL LOW (ref 12.0–15.0)
MCH: 29 pg (ref 26.0–34.0)
MCHC: 32.2 g/dL (ref 30.0–36.0)
MCV: 89.9 fL (ref 80.0–100.0)
Platelets: 424 10*3/uL — ABNORMAL HIGH (ref 150–400)
RBC: 2.97 MIL/uL — ABNORMAL LOW (ref 3.87–5.11)
RDW: 11.9 % (ref 11.5–15.5)
WBC: 8.4 10*3/uL (ref 4.0–10.5)
nRBC: 0 % (ref 0.0–0.2)

## 2020-07-09 SURGERY — VIDEO BRONCHOSCOPY WITHOUT FLUORO
Anesthesia: General | Laterality: Bilateral

## 2020-07-09 MED ORDER — LIDOCAINE HCL (PF) 4 % IJ SOLN
10.0000 mL | Freq: Once | INTRAMUSCULAR | Status: DC
  Filled 2020-07-09: qty 10

## 2020-07-09 MED ORDER — FENTANYL CITRATE (PF) 250 MCG/5ML IJ SOLN
INTRAMUSCULAR | Status: DC | PRN
  Administered 2020-07-09: 25 ug via INTRAVENOUS
  Administered 2020-07-09: 50 ug via INTRAVENOUS
  Administered 2020-07-09: 25 ug via INTRAVENOUS

## 2020-07-09 MED ORDER — INSULIN ASPART 100 UNIT/ML ~~LOC~~ SOLN: 0.0000 [IU] | Freq: Every day | SUBCUTANEOUS | Status: DC

## 2020-07-09 MED ORDER — IPRATROPIUM-ALBUTEROL 0.5-2.5 (3) MG/3ML IN SOLN
3.0000 mL | Freq: Four times a day (QID) | RESPIRATORY_TRACT | Status: DC
  Administered 2020-07-09 – 2020-07-16 (×27): 3 mL via RESPIRATORY_TRACT
  Filled 2020-07-09 (×28): qty 3

## 2020-07-09 MED ORDER — DEXMEDETOMIDINE (PRECEDEX) IN NS 20 MCG/5ML (4 MCG/ML) IV SYRINGE
PREFILLED_SYRINGE | INTRAVENOUS | Status: DC | PRN
  Administered 2020-07-09: 12 ug via INTRAVENOUS
  Administered 2020-07-09: 8 ug via INTRAVENOUS

## 2020-07-09 MED ORDER — PROPOFOL 500 MG/50ML IV EMUL
INTRAVENOUS | Status: DC | PRN
  Administered 2020-07-09: 50 ug/kg/min via INTRAVENOUS

## 2020-07-09 MED ORDER — METHYLPREDNISOLONE SODIUM SUCC 125 MG IJ SOLR
40.0000 mg | Freq: Two times a day (BID) | INTRAMUSCULAR | Status: DC
  Administered 2020-07-09 (×2): 40 mg via INTRAVENOUS
  Filled 2020-07-09 (×2): qty 2

## 2020-07-09 MED ORDER — DEXMEDETOMIDINE HCL IN NACL 200 MCG/50ML IV SOLN
0.4000 ug/kg/h | INTRAVENOUS | Status: DC
  Administered 2020-07-09: 0.7 ug/kg/h via INTRAVENOUS
  Filled 2020-07-09 (×2): qty 50

## 2020-07-09 MED ORDER — ONDANSETRON HCL 4 MG/2ML IJ SOLN
INTRAMUSCULAR | Status: DC | PRN
  Administered 2020-07-09: 4 mg via INTRAVENOUS

## 2020-07-09 MED ORDER — ONDANSETRON HCL 4 MG/2ML IJ SOLN: 4.0000 mg | Freq: Four times a day (QID) | INTRAMUSCULAR | Status: DC | PRN

## 2020-07-09 MED ORDER — FENTANYL CITRATE (PF) 100 MCG/2ML IJ SOLN: 25.0000 ug | INTRAMUSCULAR | Status: DC | PRN

## 2020-07-09 MED ORDER — ALBUTEROL SULFATE HFA 108 (90 BASE) MCG/ACT IN AERS
INHALATION_SPRAY | RESPIRATORY_TRACT | Status: DC | PRN
  Administered 2020-07-09: 2 via RESPIRATORY_TRACT

## 2020-07-09 MED ORDER — SODIUM CHLORIDE 0.9 % IV SOLN: INTRAVENOUS | Status: DC | PRN

## 2020-07-09 MED ORDER — RENA-VITE PO TABS
1.0000 | ORAL_TABLET | Freq: Every day | ORAL | Status: DC
  Administered 2020-07-09 – 2020-07-15 (×7): 1 via ORAL
  Filled 2020-07-09 (×7): qty 1

## 2020-07-09 MED ORDER — DEXAMETHASONE SODIUM PHOSPHATE 10 MG/ML IJ SOLN
INTRAMUSCULAR | Status: DC | PRN
  Administered 2020-07-09: 10 mg via INTRAVENOUS

## 2020-07-09 MED ORDER — IPRATROPIUM-ALBUTEROL 0.5-2.5 (3) MG/3ML IN SOLN
RESPIRATORY_TRACT | Status: AC
  Administered 2020-07-09: 3 mL
  Filled 2020-07-09: qty 3

## 2020-07-09 MED ORDER — MIDAZOLAM HCL 2 MG/2ML IJ SOLN
INTRAMUSCULAR | Status: DC | PRN
  Administered 2020-07-09 (×2): 1 mg via INTRAVENOUS
  Administered 2020-07-09: 2 mg via INTRAVENOUS

## 2020-07-09 MED ORDER — SUCCINYLCHOLINE CHLORIDE 200 MG/10ML IV SOSY
PREFILLED_SYRINGE | INTRAVENOUS | Status: DC | PRN
  Administered 2020-07-09: 100 mg via INTRAVENOUS

## 2020-07-09 MED ORDER — DEXMEDETOMIDINE HCL IN NACL 200 MCG/50ML IV SOLN
INTRAVENOUS | Status: DC | PRN
  Administered 2020-07-09: .7 ug/kg/h via INTRAVENOUS

## 2020-07-09 MED ORDER — PROSOURCE PLUS PO LIQD
30.0000 mL | Freq: Two times a day (BID) | ORAL | Status: DC
  Administered 2020-07-12 – 2020-07-14 (×2): 30 mL via ORAL
  Filled 2020-07-09 (×7): qty 30

## 2020-07-09 MED ORDER — SEVELAMER CARBONATE 800 MG PO TABS
800.0000 mg | ORAL_TABLET | Freq: Three times a day (TID) | ORAL | Status: DC
  Administered 2020-07-09 – 2020-07-16 (×19): 800 mg via ORAL
  Filled 2020-07-09 (×19): qty 1

## 2020-07-09 MED ORDER — PROPOFOL 10 MG/ML IV BOLUS
INTRAVENOUS | Status: DC | PRN
  Administered 2020-07-09: 150 ug via INTRAVENOUS

## 2020-07-09 MED ORDER — INSULIN ASPART 100 UNIT/ML ~~LOC~~ SOLN: 0.0000 [IU] | Freq: Three times a day (TID) | SUBCUTANEOUS | Status: DC

## 2020-07-09 MED ORDER — LIDOCAINE 2% (20 MG/ML) 5 ML SYRINGE
INTRAMUSCULAR | Status: DC | PRN
  Administered 2020-07-09: 60 mg via INTRAVENOUS

## 2020-07-09 MED ORDER — LORAZEPAM 2 MG/ML IJ SOLN: 2.0000 mg | INTRAMUSCULAR | Status: DC | PRN

## 2020-07-09 MED ORDER — IPRATROPIUM-ALBUTEROL 0.5-2.5 (3) MG/3ML IN SOLN: 3.0000 mL | Freq: Four times a day (QID) | RESPIRATORY_TRACT | Status: DC | PRN

## 2020-07-09 NOTE — Procedures (Signed)
Bronchoscopy Procedure Note  Kaitlyn Schwartz  [id number]  07-Aug-1985  Date:07/09/20  Time:11:15 AM   Provider Performing:Emmett Bracknell C Tamala Julian   Procedure(s):  Flexible bronchoscopy with brushing [phone]) and Flexible bronchoscopy with bronchial alveolar lavage ([id number])  Indication(s) Immunocompromised pneumonia  Consent Risks of the procedure as well as the alternatives and risks of each were explained to the patient and/or caregiver.  Consent for the procedure was obtained and is signed in the bedside chart  Anesthesia General   Time Out Verified patient identification, verified procedure, site/side was marked, verified correct patient position, special equipment/implants available, medications/allergies/relevant history reviewed, required imaging and test results available.   Sterile Technique Usual hand hygiene, masks, gowns, and gloves were used   Procedure Description Bronchoscope advanced through endotracheal tube and into airway.  Airways were examined down to subsegmental level with findings noted below.   Following diagnostic evaluation, BAL(s) performed in LLL/RLL with normal saline and return of cloudy fluid and Brushing(s) performed in RLL/LLL  Findings:  - Copious mucoid secretions suctioned - Minimal airway edema   Complications/Tolerance None; patient tolerated the procedure well. Chest X-ray is not needed post procedure.   EBL Minimal   Specimen(s) - RLL BAL - LLL BAL - RLL brushing - LLL brushing

## 2020-07-09 NOTE — Transfer of Care (Signed)
Immediate Anesthesia Transfer of Care Note  Patient: Kaitlyn Schwartz  Procedure(s) Performed: VIDEO BRONCHOSCOPY WITHOUT FLUORO (Bilateral )  Patient Location: PACU  Anesthesia Type:General  Level of Consciousness: calm with precedex gtt  Airway & Oxygen Therapy: Patient Spontanous Breathing and non-rebreather face mask  Post-op Assessment: Report given to RN  Post vital signs: Reviewed and stable  Last Vitals:  Vitals Value Taken Time  BP 129/71 07/09/20 1156  Temp 36.2 C 07/09/20 1156  Pulse 105 07/09/20 1200  Resp 29 07/09/20 1200  SpO2 95 % 07/09/20 1200  Vitals shown include unvalidated device data.  Last Pain:  Vitals:   07/09/20 1156  TempSrc: Temporal  PainSc: 0-No pain         Complications: No complications documented.

## 2020-07-09 NOTE — Progress Notes (Signed)
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  In the emergency room, she was 75% on room air.  CT chest showed multifocal pneumonia.  Currently stays in the hospital with high flow oxygen.   Assessment & Plan:   Active Problems:   Multifocal pneumonia  Multifocal pneumonia in a patient who is immunocompromised with acute hypoxemic respiratory failure: Antibiotics to treat bacterial pneumonia including vancomycin and cefepime. Chest physiotherapy, incentive spirometry, deep breathing exercises, sputum induction, mucolytic's and bronchodilators. Sputum cultures, blood cultures, Legionella and streptococcal antigen pending. Supplemental oxygen to keep saturations more than 90%. Patient is seen by PCCM and is scheduled for bronchoscopy today with biopsies and cultures. Since she is on high flow oxygen, she may end up needing intubation for procedure. SpO2: 94 % O2 Flow Rate (L/min): 10 L/min  ESRD on hemodialysis: Monday Wednesday Friday dialysis.  Last dialysis on 3/11.  Followed by nephrology.  Renal transplant: Patient on tacrolimus and prednisone that is continued.  Hypertension: Blood pressure stable on amlodipine and losartan.  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  Continue.   DVT prophylaxis: heparin injection 5,000 Units Start: 07/07/20 2200   Code Status: Full code Family Communication: Mother at the bedside Disposition Plan: Status is: Inpatient  Remains inpatient appropriate because:IV treatments appropriate due to intensity of illness or  inability to take PO and Inpatient level of care appropriate due to severity of illness   Dispo: The patient is from: Home              Anticipated d/c is to: Home              Patient currently is not medically stable to d/c.   Difficult to place patient No         Consultants:   PCCM  Nephrology  Procedures:   None  Antimicrobials:   Vancomycin, cefepime 3/10---   Subjective: Patient seen and examined.  Overnight events noted.  She feels short of breath even at rest.  Febrile overnight with 102.7.  Now feeling nauseous.  She could not take much of the oral medicine because of nausea.  Mom at the bedside.  She was nervous about going for bronc and we discussed about the procedure.  Objective: Vitals:   07/09/20 0119 07/09/20 0249 07/09/20 0632 07/09/20 0814  BP: 126/79  124/84   Pulse: (!) 115  94   Resp: 18  18   Temp: (!) 102.7 F (39.3 C) 99.7 F (37.6 C) 98.4 F (36.9 C)   TempSrc: Oral Oral Oral   SpO2: 95%  93% 94%  Weight:      Height:        Intake/Output Summary (Last 24 hours) at 07/09/2020 0940 Last data filed at 07/09/2020 0400 Gross per 24 hour  Intake 250 ml  Output 100 ml  Net 150 ml   Filed Weights   07/07/20 1354  Weight: 73.9 kg    Examination:  General exam: Sick looking.  Currently on 10 L of oxygen.  Able to have conversation. Respiratory system: Mostly clear.  Poor air entry at bases. Cardiovascular system:  S1 & S2 heard, tachycardic.   Gastrointestinal system: Abdomen is nondistended, soft and nontender. No organomegaly or masses felt. Normal bowel sounds heard. Central nervous system: Alert and oriented. No focal neurological deficits. Extremities: Symmetric 5 x 5 power. Left forearm with AV fistula.    Data Reviewed: I have personally reviewed following labs and imaging studies  CBC: Recent Labs  Lab 07/03/20 1022 07/07/20 1411 07/08/20 0205 07/09/20 0055  WBC 5.8 8.4 7.2 8.4  NEUTROABS  --  6.0  --   --   HGB  9.5* 10.0* 8.3* 8.6*  HCT 29.5* 31.0* 25.7* 26.7*  MCV 90.8 90.1 91.8 89.9  PLT 518* 513* 410* 485*   Basic Metabolic Panel: Recent Labs  Lab 07/03/20 1022 07/07/20 1411 07/08/20 0205 07/09/20 0055  NA 137 139 135 138  K 3.9 3.6 3.6 4.0  CL 97* 99 97* 99  CO2 24 27 26 29   GLUCOSE 96 89 100* 84  BUN 26* 12 17 5*  CREATININE 9.88* 7.83* 9.31* 4.47*  CALCIUM 9.1 8.6* 8.0* 9.1  PHOS  --   --  5.0*  --    GFR: Estimated Creatinine Clearance: 16.5 mL/min (A) (by C-G formula based on SCr of 4.47 mg/dL (H)). Liver Function Tests: Recent Labs  Lab 07/03/20 1022  AST 20  ALT 30  ALKPHOS 96  BILITOT 0.6  PROT 7.0  ALBUMIN 3.3*   Recent Labs  Lab 07/03/20 1022  LIPASE 32   No results for input(s): AMMONIA in the last 168 hours. Coagulation Profile: No results for input(s): INR, PROTIME in the last 168 hours. Cardiac Enzymes: No results for input(s): CKTOTAL, CKMB, CKMBINDEX, TROPONINI in the last 168 hours. BNP (last 3 results) No results for input(s): PROBNP in the last 8760 hours. HbA1C: No results for input(s): HGBA1C in the last 72 hours. CBG: No results for input(s): GLUCAP in the last 168 hours. Lipid Profile: No results for input(s): CHOL, HDL, LDLCALC, TRIG, CHOLHDL, LDLDIRECT in the last 72 hours. Thyroid Function Tests: No results for input(s): TSH, T4TOTAL, FREET4, T3FREE, THYROIDAB in the last 72 hours. Anemia Panel: Recent Labs    07/08/20 0205  FERRITIN 630*  TIBC 134*  IRON 23*   Sepsis Labs: Recent Labs  Lab 07/07/20 1459 07/08/20 0205  PROCALCITON  --  86.33  LATICACIDVEN 0.7  --     Recent Results (from the past 240 hour(s))  Blood culture (routine x 2)     Status: None (Preliminary result)   Collection Time: 07/07/20  2:17 PM   Specimen: BLOOD  Result Value Ref Range Status   Specimen Description BLOOD RIGHT ANTECUBITAL  Final   Special Requests   Final    BOTTLES DRAWN AEROBIC AND ANAEROBIC Blood Culture results may not be optimal  due to an excessive volume of blood received in culture bottles   Culture   Final    NO GROWTH < 24 HOURS Performed at [facility] [address] [place], [place] [address]    Report Status PENDING  Incomplete  Resp Panel by RT-PCR (Flu A&B, Covid) Nasopharyngeal Swab     Status: None   Collection Time: 07/07/20  2:34 PM   Specimen: Nasopharyngeal Swab; Nasopharyngeal(NP) swabs in vial transport medium  Result Value Ref Range Status   SARS Coronavirus 2 by RT PCR NEGATIVE NEGATIVE Final    Comment: (NOTE) SARS-CoV-2 target nucleic acids are NOT DETECTED.  The SARS-CoV-2 RNA is generally detectable in upper respiratory specimens during the acute  phase of infection. The lowest concentration of SARS-CoV-2 viral copies this assay can detect is 138 copies/mL. A negative result does not preclude SARS-Cov-2 infection and should not be used as the sole basis for treatment or other patient management decisions. A negative result may occur with  improper specimen collection/handling, submission of specimen other than nasopharyngeal swab, presence of viral mutation(s) within the areas targeted by this assay, and inadequate number of viral copies(<138 copies/mL). A negative result must be combined with clinical observations, patient history, and epidemiological information. The expected result is Negative.  Fact Sheet for Patients:  EntrepreneurPulse.com.au  Fact Sheet for Healthcare Providers:  IncredibleEmployment.be  This test is no t yet approved or cleared by the [place] FDA and  has been authorized for detection and/or diagnosis of SARS-CoV-2 by FDA under an Emergency Use Authorization (EUA). This EUA will remain  in effect (meaning this test can be used) for the duration of the COVID-19 declaration under Section 564(b)(1) of the Act, 21 U.S.C.section 360bbb-3(b)(1), unless the authorization is terminated  or revoked sooner.        Influenza A by PCR NEGATIVE NEGATIVE Final   Influenza B by PCR NEGATIVE NEGATIVE Final    Comment: (NOTE) The Xpert Xpress SARS-CoV-2/FLU/RSV plus assay is intended as an aid in the diagnosis of influenza from Nasopharyngeal swab specimens and should not be used as a sole basis for treatment. Nasal washings and aspirates are unacceptable for Xpert Xpress SARS-CoV-2/FLU/RSV testing.  Fact Sheet for Patients: EntrepreneurPulse.com.au  Fact Sheet for Healthcare Providers: IncredibleEmployment.be  This test is not yet approved or cleared by the [place] FDA and has been authorized for detection and/or diagnosis of SARS-CoV-2 by FDA under an Emergency Use Authorization (EUA). This EUA will remain in effect (meaning this test can be used) for the duration of the COVID-19 declaration under Section 564(b)(1) of the Act, 21 U.S.C. section 360bbb-3(b)(1), unless the authorization is terminated or revoked.  Performed at [facility], [place] [address] [place], [place] [address]   MRSA PCR Screening     Status: None   Collection Time: 07/07/20 10:12 PM   Specimen: Nasal Mucosa; Nasopharyngeal  Result Value Ref Range Status   MRSA by PCR NEGATIVE NEGATIVE Final    Comment:        The GeneXpert MRSA Assay (FDA approved for NASAL specimens only), is one component of a comprehensive MRSA colonization surveillance program. It is not intended to diagnose MRSA infection nor to guide or monitor treatment for MRSA infections. Performed at [facility], [place] [address], [place], [place] [address]          Radiology Studies: CT Angio Chest PE W/Cm &/Or Wo Cm  Result Date: 07/07/2020 CLINICAL DATA:  Pneumonia 3 weeks ago shortness of breath EXAM: CT ANGIOGRAPHY CHEST WITH CONTRAST TECHNIQUE: Multidetector CT imaging of the chest was performed using the standard protocol during bolus administration of intravenous contrast. Multiplanar CT  image reconstructions and MIPs were obtained to evaluate the vascular anatomy. CONTRAST:  55mL OMNIPAQUE IOHEXOL 350 MG/ML SOLN COMPARISON:  None. FINDINGS: Cardiovascular: There is a optimal opacification of the pulmonary arteries. There is no central,segmental, or subsegmental filling defects within the pulmonary arteries. The heart is normal in size. A small pericardial effusion is seen. No evidence right heart strain. There is normal three-vessel brachiocephalic anatomy without proximal stenosis. The thoracic aorta is normal in appearance. Mediastinum/Nodes: No hilar, mediastinal, or axillary adenopathy. Thyroid gland, trachea, and esophagus demonstrate no significant  findings. Lungs/Pleura: Extensive multifocal patchy ground-glass opacities are seen throughout both lungs. There is a small right and trace left pleural effusion present. Upper Abdomen: No acute abnormalities present in the visualized portions of the upper abdomen. Musculoskeletal: No chest wall abnormality. No acute or significant osseous findings. Mild S-shaped scoliotic curvature is noted. Review of the MIP images confirms the above findings. IMPRESSION: No central, segmental, or subsegmental pulmonary embolism Extensive multifocal airspace opacities, consistent with multifocal pneumonia Small pericardial effusion Small right and trace left pleural effusion. Electronically Signed   By: Prudencio Pair M.D.   On: 07/07/2020 16:56   DG Chest Portable 1 View  Result Date: 07/07/2020 CLINICAL DATA:  Shortness of breath EXAM: PORTABLE CHEST 1 VIEW COMPARISON:  July 03, 2020 FINDINGS: There is airspace opacity in the lower lobe regions, slightly progressed. There is new infiltrate throughout portions of the left and right mid lung regions and portions of the right upper lobe. Heart size and pulmonary vascular normal. No adenopathy. No bone lesions. IMPRESSION: Multifocal pneumonia with progression of infiltrate in the right upper lobe as well as in  the mid lung regions. Extensive infiltrate in each lower lobe persists without significant change from recent study. Question atypical organism pneumonia given this appearance. Check of COVID-19 status advised. Heart size normal.  No adenopathy evident. Electronically Signed   By: Lowella Grip III M.D.   On: 07/07/2020 15:08        Scheduled Meds: . acidophilus  1 capsule Oral Daily  . acyclovir  400 mg Oral Daily  . amLODipine  2.5 mg Oral Daily  . benzonatate  200 mg Oral BID  . buPROPion  100 mg Oral Daily  . Chlorhexidine Gluconate Cloth  6 each Topical Q0600  . escitalopram  20 mg Oral Daily  . feeding supplement (NEPRO CARB STEADY)  237 mL Oral BID BM  . guaiFENesin  1,200 mg Oral BID  . heparin  5,000 Units Subcutaneous Q12H  . mouth rinse  15 mL Mouth Rinse BID  . predniSONE  5 mg Oral Q breakfast  . sevelamer carbonate  1,600 mg Oral TID WC  . simvastatin  10 mg Oral q1800  . Sirolimus  2 mg Oral Daily  . sodium bicarbonate  1,300 mg Oral Daily   Continuous Infusions: . ceFEPime (MAXIPIME) IV 1 g (07/09/20 0201)  . vancomycin Stopped (07/08/20 1425)     LOS: 2 days    Time spent: 30 minutes    Barb Merino, MD [facility] Pager [phone]

## 2020-07-09 NOTE — Progress Notes (Signed)
@   0849 Pt refused the following meds bc they make her stomach upset   Risquad Acyclovir  Buprion Lexapro Nepro (NPO) Medline Renvela Sodium Bicarbonate

## 2020-07-09 NOTE — Progress Notes (Signed)
Nephrology Follow-Up Consult note   Assessment/Recommendations: Kaitlyn Schwartz is a/an 35 y.o. female with a past medical history significant for ESRD on HD admitted with multifocal pneumonia.   # ESRD: Will need to obtain outpatient prescription from [facility].  Continue dialysis on MWF schedule.  3hrs, 3K, 2.5Ca, Na 137, Dialyzer: F180 # Volume/ hypertension:  Maintain EDW.  BP acceptable.  Can continue norvasc. Hold losartan for now # Anemia of Chronic Kidney Disease: Hemoglobin 8.6.  Iron saturation 17 and ferritin 630.  Will consider iron once the patient's infection improves.  Consider ESA based on outpatient records # Secondary Hyperparathyroidism/Hyperphosphatemia:  Phosphorus 5.  Decrease sevelamer to 1 tablet 3 times daily # Vascular access: Left upper extremity aVF with no issues #Multifocal pneumonia: Being treated with cefepime and vancomycin.  Plans for bronchoscopy today #History of kidney transplant: Now on dialysis but continue home sirolimus 2 mg daily and prednisone 5 mg daily  # Additional recommendations: - Dose all meds for creatinine clearance <10 ml/min  - Unless absolutely necessary, no MRIs with gadolinium.  - Implement save arm precautions. Prefer needle sticks in the dorsum of the hands or wrists. No blood pressure measurements in arm. - If blood transfusion is requested during hemodialysis sessions, please alert [place] prior to the session.     Recommendations conveyed to primary service.    [facility] 07/09/2020 9:48 AM  ___________________________________________________________  CC: ESRD  Interval History/Subjective: Patient states she had a rough night.  Dialysis went well without significant issues.  Had a lot of coughing as well as fevers.  Planning for procedure today   Medications:  Current Facility-Administered Medications  Medication Dose Route Frequency Provider Last Rate Last Admin  . acetaminophen  (TYLENOL) tablet 650 mg  650 mg Oral Q4H PRN Vernelle Emerald, MD   650 mg at 07/09/20 0149  . acidophilus (RISAQUAD) capsule 1 capsule  1 capsule Oral Daily Wynetta Fines T, MD      . acyclovir (ZOVIRAX) tablet 400 mg  400 mg Oral Daily Wynetta Fines T, MD   400 mg at 07/08/20 1305  . amLODipine (NORVASC) tablet 2.5 mg  2.5 mg Oral Daily Wynetta Fines T, MD   2.5 mg at 07/09/20 0846  . benzonatate (TESSALON) capsule 200 mg  200 mg Oral BID Wynetta Fines T, MD   200 mg at 07/09/20 0846  . buPROPion [facility]) tablet 100 mg  100 mg Oral Daily Wynetta Fines T, MD   100 mg at 07/08/20 1304  . ceFEPIme (MAXIPIME) 1 g in sodium chloride 0.9 % 100 mL IVPB  1 g Intravenous Q24H Duanne Limerick, RPH 200 mL/hr at 07/09/20 0201 1 g at 07/09/20 0201  . Chlorhexidine Gluconate Cloth 2 % PADS 6 each  6 each Topical Q0600 Reesa Chew, MD   6 each at 07/09/20 [phone]  . escitalopram (LEXAPRO) tablet 20 mg  20 mg Oral Daily Wynetta Fines T, MD   20 mg at 07/08/20 1256  . feeding supplement (NEPRO CARB STEADY) liquid 237 mL  237 mL Oral BID BM Wynetta Fines T, MD      . guaiFENesin [facility]) 12 hr tablet 1,200 mg  1,200 mg Oral BID Wynetta Fines T, MD   1,200 mg at 07/09/20 0846  . heparin injection 5,000 Units  5,000 Units Subcutaneous Q12H Lequita Halt, MD   5,000 Units at 07/09/20 0847  . ipratropium-albuterol (DUONEB) 0.5-2.5 (3) MG/3ML nebulizer solution 3 mL  3 mL Nebulization  Q6H PRN Oswald Hillock, MD      . MEDLINE mouth rinse  15 mL Mouth Rinse BID Wynetta Fines T, MD   15 mL at 07/09/20 0148  . ondansetron (ZOFRAN) injection 4 mg  4 mg Intravenous Q6H PRN Barb Merino, MD      . predniSONE (DELTASONE) tablet 5 mg  5 mg Oral Q breakfast Wynetta Fines T, MD   5 mg at 07/09/20 0846  . sevelamer carbonate (RENVELA) tablet 1,600 mg  1,600 mg Oral TID WC Wynetta Fines T, MD   1,600 mg at 07/08/20 1723  . simvastatin (ZOCOR) tablet 10 mg  10 mg Oral q1800 Wynetta Fines T, MD   10 mg at 07/08/20 1723  . sirolimus (RAPAMUNE)  tablet 2 mg  2 mg Oral Daily Wynetta Fines T, MD   2 mg at 07/09/20 0845  . sodium bicarbonate tablet 1,300 mg  1,300 mg Oral Daily Wynetta Fines T, MD   1,300 mg at 07/08/20 1301  . vancomycin (VANCOCIN) IVPB 750 mg/150 ml premix  750 mg Intravenous Q M,W,F-HD Tegeler, Gwenyth Allegra, MD   Stopped at 07/08/20 1425      Review of Systems: 10 systems reviewed and negative except per interval history/subjective  Physical Exam: Vitals:   07/09/20 0632 07/09/20 0814  BP: 124/84   Pulse: 94   Resp: 18   Temp: 98.4 F (36.9 C)   SpO2: 93% 94%   No intake/output data recorded.  Intake/Output Summary (Last 24 hours) at 07/09/2020 0948 Last data filed at 07/09/2020 0400 Gross per 24 hour  Intake 250 ml  Output 100 ml  Net 150 ml   Constitutional: Ill-appearing, lying in bed, no distress ENMT: ears and nose without scars or lesions, MMM CV: normal rate, no edema Respiratory: Increased work of breathing, coughing, bilateral chest rise Gastrointestinal: soft, non-tender, no palpable masses or hernias Skin: no visible lesions or rashes Psych: alert, judgement/insight appropriate, appropriate mood and affect   Test Results I personally reviewed new and old clinical labs and radiology tests Lab Results  Component Value Date   NA 138 07/09/2020   K 4.0 07/09/2020   CL 99 07/09/2020   CO2 29 07/09/2020   BUN 5 (L) 07/09/2020   CREATININE 4.47 (H) 07/09/2020   CALCIUM 9.1 07/09/2020   ALBUMIN 3.3 (L) 07/03/2020   PHOS 5.0 (H) 07/08/2020

## 2020-07-09 NOTE — Progress Notes (Signed)
NAME:  Kaitlyn Schwartz, MRN:  [id number], DOB:  11/30/1985, LOS: 2 ADMISSION DATE:  07/07/2020, CONSULTATION DATE:  07/08/2020 REFERRING MD:  Dr. 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  Recent admit for same at [facility] and outpatient treatment for ongoing pneumonia.  Admitted by [facility].  Pulmonary consulted for further evaluation.   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    Recently dmitted at [facility] 2/25- 2/28 and treated for CAP/ multifocal pneumonia, right greater than left.  Neg RVP panel.  Noted to be treated inpatient with ceftriaxone and azithro however patient unable to tolerate azithro secondary to nausea and was discontinued.  Discharged home on Augmentin.  Completed course however but then developed SOB again with nausea, vomiting, and diarrhea.  Evaluated in ER on 3/6 with CT abdomen concerning for possibly colitis (felt likely secondary to her recent abx use) and CXR still showing patchy bilateral airspace opacities.  Negative for COVID/ flu.  She was sent home on 10 course of doxycycline with plans to f/u with PCP.  However she was unable to tolerate doxycycline course secondary to nausea and took two day course.   Of note, has remained on iHD for the last 5 years given partial rejection and remained on immunosuppressant medications including tacrolimus and prednisone at the recommendations of her nephrologist.  She remains on the renal transplant list.  Presented back to the ER on 3/10, sent from her PCP office where she has  ongoing exertional dyspnea, fever of 101, and found to be hypoxic on room air at 75%.  She was unable to have a full iHD session on 3/9.   Reports some nasal congestion and rhinorrhea, productive cough with dark brown sputum, denies hemoptysis.   In ER, patient afebrile, hemodynamically stable and requiring 4L [place].  Labs noted for normal WBC, Hgb 10-> 8.3, sCr 7.83, K 3.6, normal lactic acid, neg troponin, neg COVID/ flu, CXR showing progression of right upper lobe infiltrate and unchanged extensive infiltrates in the lower lobes.  CTA PE was negative for PE but did show multifocal pneumonia.  She was admitted to [facility] and started on vancomycin and cefepime coverage.  Pulmonary consulted for further recommendations.   Symptoms began 3 weeks ago, dry cough, DOE, subjective fevers, malaise.  Worsening SOB over past 2 weeks.  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  Micro Data:  3/10 SARS/ flu >> neg 3/10 BCx >> neg 3/10 MRSA >>  Antimicrobials:  3/10 cefepime >> 3/10 vanc >>  Interim History / Subjective:  Feels pretty rotten.  Febrile overnight.  On 10L O2.  Dry cough persists, no sputum.  Objective   Blood pressure (!) 169/81, pulse (!) 104, temperature 98.2 F (36.8 C), temperature  source Temporal, resp. rate (!) 27, height 5\' 2"  (1.575 m), weight 73.9 kg, last menstrual period 06/29/2020, SpO2 95 %.        Intake/Output Summary (Last 24 hours) at 07/09/2020 1045 Last data filed at 07/09/2020 0400 Gross per 24 hour  Intake 250 ml  Output 100 ml  Net 150 ml   Filed Weights   07/07/20 1354 07/09/20 1003  Weight: 73.9 kg 73.9 kg    Examination: Constitutional: mild resp distress  Eyes: EOMI, pupils equal Ears, nose, mouth, and throat: MMM, trachea midline Cardiovascular: tachycardic, strong peripheral pulses Respiratory: scattered crackles, mildly tachypneic Gastrointestinal: soft, hypoactive BS Skin: No rashes, normal turgor Neurologic: moves all 4 ext to command Psychiatric: anxious  Markedly elevated Pct noted   Resolved Hospital Problem list    Assessment & Plan:   Multifocal PNA in the immunosuppressed patient: opportunistic vs. HCAP vs. Inflammatory vs. Drug-associated; severely elevated Pct argues for bacterial Acute on chronic hypoxic respiratory failure  - Bronch today, high risk for needing to remain on vent postop, patient understands and wants to process - Continue broad spectrum antibiotics - Add steroids, some literature this helps with severe PNA  ESRD on MWF iHD S/p kidney transplant with partial rejection - continue anti-rejection meds - iHD per nephrology  Hx CMV infection - daily acyclovir  Best practice (evaluated daily)  Diet: NPO for bronch Pain/Anxiety/Delirium protocol (if indicated): n/a VAP protocol (if indicated): per primary DVT prophylaxis: : per primary GI prophylaxis: : per primary Glucose control: : per primary Mobility: : per primary Disposition:: per primary  Goals of Care:  n/a  Erskine Emery MD PCCM

## 2020-07-09 NOTE — Progress Notes (Signed)
Initial Nutrition Assessment  DOCUMENTATION CODES:   Not applicable  INTERVENTION:  75m Prosource Plus po BID, each supplement provides 100 kcals and 15 grams of protein  Renavite daily  Continue Nepro Shake po BID, each supplement provides 425 kcal and 19 grams protein  NUTRITION DIAGNOSIS:   Increased nutrient needs related to chronic illness (ESRD on HD) as evidenced by estimated needs.    GOAL:   Patient will meet greater than or equal to 90% of their needs    MONITOR:   PO intake,Supplement acceptance,Labs,Weight trends,I & O's  REASON FOR ASSESSMENT:   Malnutrition Screening Tool    ASSESSMENT:   Pt admitted with acute hypoxic respiratory failure 2/2 multifocal PNA. PMH includes ESRD on HD s/p kidney transplant 2005 partial rejection, on immunosuppressant 2/2 atrophic native kidneys, s/p CMV infections, HTN, anxiety/depression, HLD.   Pt unavailable at time of RD visit. CCM performing Flexible bronchoscopy with brushing and Flexible bronchoscopy with bronchial alveolar lavage today.  No PO intake documented. Pt with orders for Nepro BID but has not received one due to being NPO. Unsure if pt likes this supplement at this time. Will continue with current plan of care and monitor for tolerance.   No wt history available to review EDW unknown at this time  UOP: 1026mdocumented x24 hours  Medications: acidophilus, solu-medrol, renvela, sodium bicarbonate Labs: Cr 4.47 (H, lower than yesterday)  Diet Order:   Diet Order            Diet NPO time specified  Diet effective midnight                 EDUCATION NEEDS:   No education needs have been identified at this time  Skin:  Skin Assessment: Reviewed RN Assessment  Last BM:  3/9  Height:   Ht Readings from Last 1 Encounters:  07/09/20 _0  (1.575 m)    Weight:   Wt Readings from Last 1 Encounters:  07/09/20 75 kg   BMI:  Body mass index is 30.24 kg/m.  Estimated Nutritional Needs:    Kcal:  2000-2200  Protein:  100-110g  Fluid:  1L+UOP    AmLarkin InaMS, RD, LDN RD pager number and weekend/on-call pager number located in [place]

## 2020-07-09 NOTE — Progress Notes (Addendum)
Having a rough time in endo with coughing fits, panic attacks, desats. Doing better with precedex, NRB, neb. Will watch in ICU today for precedex wean, can prob go to floor in AM. Updated mother by phone who will update spouse.  Erskine Emery MD PCCM

## 2020-07-09 NOTE — Anesthesia Postprocedure Evaluation (Signed)
Anesthesia Post Note  Patient: Garnette Greb  Procedure(s) Performed: VIDEO BRONCHOSCOPY WITHOUT FLUORO (Bilateral ) BRONCHIAL WASHINGS BRONCHIAL BRUSHINGS     Patient location during evaluation: PACU Anesthesia Type: General Level of consciousness: awake and alert Pain management: pain level controlled Vital Signs Assessment: post-procedure vital signs reviewed and stable Respiratory status: spontaneous breathing, nonlabored ventilation, respiratory function stable and non-rebreather facemask Cardiovascular status: blood pressure returned to baseline and stable Postop Assessment: no apparent nausea or vomiting Anesthetic complications: no   No complications documented.  Last Vitals:  Vitals:   07/09/20 1308 07/09/20 1311  BP:  137/65  Pulse: 86 86  Resp: 20 (!) 27  Temp:    SpO2: 100% 100%    Last Pain:  Vitals:   07/09/20 1311  TempSrc:   PainSc: 0-No pain                 FITZGERALD,W. EDMOND

## 2020-07-09 NOTE — Anesthesia Preprocedure Evaluation (Addendum)
Anesthesia Evaluation  Patient identified by MRN, date of birth, ID band Patient awake    Reviewed: Allergy & Precautions, H&P , NPO status , Patient's Chart, lab work & pertinent test results  Airway Mallampati: II  TM Distance: >3 FB Neck ROM: Full    Dental no notable dental hx. (+) Teeth Intact, Dental Advisory Given   Pulmonary pneumonia, unresolved,    Pulmonary exam normal  + decreased breath sounds      Cardiovascular negative cardio ROS   Rhythm:Regular Rate:Normal     Neuro/Psych negative neurological ROS  negative psych ROS   GI/Hepatic negative GI ROS, Neg liver ROS,   Endo/Other  negative endocrine ROS  Renal/GU ESRF and DialysisRenal disease  negative genitourinary   Musculoskeletal   Abdominal   Peds  Hematology  (+) anemia ,   Anesthesia Other Findings   Reproductive/Obstetrics negative OB ROS                           Anesthesia Physical Anesthesia Plan  ASA: III  Anesthesia Plan: General   Post-op Pain Management:    Induction: Intravenous  PONV Risk Score and Plan: 3 and Ondansetron and Dexamethasone  Airway Management Planned: Oral ETT  Additional Equipment:   Intra-op Plan:   Post-operative Plan: Extubation in OR  Informed Consent: I have reviewed the patients History and Physical, chart, labs and discussed the procedure including the risks, benefits and alternatives for the proposed anesthesia with the patient or authorized representative who has indicated his/her understanding and acceptance.     Dental advisory given  Plan Discussed with: CRNA  Anesthesia Plan Comments:        Anesthesia Quick Evaluation

## 2020-07-09 NOTE — Anesthesia Procedure Notes (Signed)
Procedure Name: Intubation Date/Time: 07/09/2020 10:50 AM Performed by: Clearnce Sorrel, CRNA Pre-anesthesia Checklist: Emergency Drugs available, Suction available, Patient identified, Patient being monitored and Timeout performed Patient Re-evaluated:Patient Re-evaluated prior to induction Oxygen Delivery Method: Circle system utilized Induction Type: IV induction and Rapid sequence Laryngoscope Size: Mac and 3 Grade View: Grade I Tube type: Oral Tube size: 8.0 mm Number of attempts: 1 Airway Equipment and Method: Stylet Placement Confirmation: ETT inserted through vocal cords under direct vision,  positive ETCO2 and breath sounds checked- equal and bilateral Secured at: 22 cm Tube secured with: Tape Dental Injury: Teeth and Oropharynx as per pre-operative assessment

## 2020-07-09 NOTE — Progress Notes (Signed)
At bedside to place a new PIV and patient request we leave the PIV in that she has. She has 20g in right AC flushed well with no issues. Patient states she is a hard stick and does not want it moved.

## 2020-07-09 NOTE — Progress Notes (Signed)
[place] Progress Note Patient Name: Kaitlyn Schwartz DOB: Apr 06, 1986 MRN: [id number]   Date of Service  07/09/2020  HPI/Events of Note  RN requests CBG checks for this patient on steroids with CBG of 199. They are ordered for a diet.   eICU Interventions  Ordered QACHS insulin sliding scale + CBG checks.     Intervention Category Intermediate Interventions: Hyperglycemia - evaluation and treatment  Charlott Rakes 07/09/2020, 11:46 PM

## 2020-07-10 DIAGNOSIS — J189 Pneumonia, unspecified organism: Secondary | ICD-10-CM | POA: Diagnosis not present

## 2020-07-10 DIAGNOSIS — J9601 Acute respiratory failure with hypoxia: Secondary | ICD-10-CM | POA: Diagnosis not present

## 2020-07-10 LAB — GLUCOSE, CAPILLARY
Glucose-Capillary: 153 mg/dL — ABNORMAL HIGH (ref 70–99)
Glucose-Capillary: 135 mg/dL — ABNORMAL HIGH (ref 70–99)
Glucose-Capillary: 164 mg/dL — ABNORMAL HIGH (ref 70–99)
Glucose-Capillary: 135 mg/dL — ABNORMAL HIGH (ref 70–99)
Glucose-Capillary: 151 mg/dL — ABNORMAL HIGH (ref 70–99)

## 2020-07-10 LAB — HEMOGLOBIN A1C
Hgb A1c MFr Bld: 5.8 % — ABNORMAL HIGH (ref 4.8–5.6)
Mean Plasma Glucose: 119.76 mg/dL

## 2020-07-10 LAB — LEGIONELLA PNEUMOPHILA SEROGP 1 UR AG: L. pneumophila Serogp 1 Ur Ag: NEGATIVE

## 2020-07-10 MED ORDER — PREDNISONE 5 MG PO TABS
5.0000 mg | ORAL_TABLET | Freq: Every day | ORAL | Status: DC
  Administered 2020-07-10 – 2020-07-16 (×7): 5 mg via ORAL
  Filled 2020-07-10 (×7): qty 1

## 2020-07-10 MED ORDER — SODIUM CHLORIDE 0.9 % IV SOLN: 2.0000 g | INTRAVENOUS | Status: DC

## 2020-07-10 MED ORDER — GABAPENTIN 300 MG PO CAPS
300.0000 mg | ORAL_CAPSULE | Freq: Every day | ORAL | Status: DC | PRN
Start: 2020-07-10 — End: 2020-07-16
  Administered 2020-07-10 – 2020-07-15 (×4): 300 mg via ORAL
  Filled 2020-07-10 (×4): qty 1

## 2020-07-10 MED ORDER — SODIUM CHLORIDE 0.9 % IV SOLN
1.0000 g | INTRAVENOUS | Status: DC
  Administered 2020-07-10 – 2020-07-15 (×6): 1 g via INTRAVENOUS
  Filled 2020-07-10 (×7): qty 1

## 2020-07-10 NOTE — Progress Notes (Signed)
RT instructed patient on the use of flutter valve and incentive spirometer. Patient able to demonstrate back good technique. Patient able to reach 1051mL using the incentive spirometer, but complained about chest pain.  RT recommended a goal of 535mL and work her way up to the 1029mL.  Patient had a non-productive coughing fit using the flutter valve and complained of sore throat pain. RN notified.

## 2020-07-10 NOTE — Progress Notes (Signed)
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  In the emergency room, she was 75% on room air.  CT chest showed multifocal pneumonia.     Assessment & Plan:   Active Problems:   Multifocal pneumonia  Multifocal pneumonia in immunocompromised , acute hypoxemic respiratory failure:  Antibiotics to treat bacterial pneumonia including vancomycin and cefepime. Chest physiotherapy, incentive spirometry, deep breathing exercises, sputum induction, mucolytic's and bronchodilators. Sputum cultures, blood cultures, Legionella and streptococcal antigen pending and negative so far. Supplemental oxygen to keep saturations more than 90%. Patient is seen by PCCM and underwent bronchoscopy on 3/12, no positive growth so far. Still remains on significant nasal cannula oxygen, currently on 10 L/min however clinically improving since bronchoscopy.  ESRD on hemodialysis: Monday Wednesday Friday dialysis.  Last dialysis on 3/11.  Followed by nephrology.  Renal transplant: Patient on tacrolimus and low-dose prednisone that is continued.  Hypertension: Blood pressure stable on amlodipine and losartan.  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  Continue.   DVT prophylaxis: heparin injection 5,000 Units Start: 07/07/20 2200   Code Status: Full code Family Communication: Patient's father at bedside. Disposition Plan: Status is: Inpatient  Remains inpatient appropriate because:IV treatments appropriate due to intensity of illness or inability to take PO and Inpatient level of  care appropriate due to severity of illness   Dispo: The patient is from: Home              Anticipated d/c is to: Home              Patient currently is not medically stable to d/c.   Difficult to place patient No         Consultants:   PCCM  Nephrology  Procedures:   None  Antimicrobials:   Vancomycin, cefepime 3/10---   Subjective: Patient seen and examined.  Overnight no events.  Patient thinks her breathing is slightly better.  Oxygen on her nose bothers her.  Unable to take deep breathing due to cough.  Afebrile. Objective: Vitals:   07/10/20 1000 07/10/20 1100 07/10/20 1200 07/10/20 1232  BP: 121/83 (!) 126/93 121/88 121/88  Pulse: (!) 105 (!) 114 (!) 114 (!) 116  Resp: (!) _0 (!) 21  Temp:   98.2 F (36.8 C)   TempSrc:   Oral   SpO2: 100% 98% 98% 95%  Weight:      Height:        Intake/Output Summary (Last 24 hours) at 07/10/2020 1349 Last data filed at 07/10/2020 0900 Gross per 24 hour  Intake 428.71 ml  Output --  Net 428.71 ml   Filed Weights   07/07/20 1354 07/09/20 1003 07/09/20 1300  Weight: 73.9 kg 73.9 kg 75 kg    Examination:  General exam: In mild distress especially taking deep breaths and coughing.  Currently on 10 L of oxygen.  Able to have conversation with no difficulties. Respiratory system: Mostly clear.  Poor air entry at bases. Cardiovascular system: S1 & S2 heard, regular rate rhythm. Gastrointestinal system: Abdomen is nondistended, soft and nontender. No organomegaly or masses felt. Normal bowel sounds  heard. Central nervous system: Alert and oriented. No focal neurological deficits. Extremities: Symmetric 5 x 5 power. Left forearm with AV fistula.    Data Reviewed: I have personally reviewed following labs and imaging studies  CBC: Recent Labs  Lab 07/07/20 1411 07/08/20 0205 07/09/20 0055  WBC 8.4 7.2 8.4  NEUTROABS 6.0  --   --   HGB 10.0* 8.3* 8.6*  HCT 31.0* 25.7* 26.7*  MCV 90.1 91.8 89.9  PLT  513* 410* 341*   Basic Metabolic Panel: Recent Labs  Lab 07/07/20 1411 07/08/20 0205 07/09/20 0055  NA 139 135 138  K 3.6 3.6 4.0  CL 99 97* 99  CO2 _0 GLUCOSE 89 100* 84  BUN 12 17 5*  CREATININE 7.83* 9.31* 4.47*  CALCIUM 8.6* 8.0* 9.1  PHOS  --  5.0*  --    GFR: Estimated Creatinine Clearance: 16.7 mL/min (A) (by C-G formula based on SCr of 4.47 mg/dL (H)). Liver Function Tests: No results for input(s): AST, ALT, ALKPHOS, BILITOT, PROT, ALBUMIN in the last 168 hours. No results for input(s): LIPASE, AMYLASE in the last 168 hours. No results for input(s): AMMONIA in the last 168 hours. Coagulation Profile: No results for input(s): INR, PROTIME in the last 168 hours. Cardiac Enzymes: No results for input(s): CKTOTAL, CKMB, CKMBINDEX, TROPONINI in the last 168 hours. BNP (last 3 results) No results for input(s): PROBNP in the last 8760 hours. HbA1C: Recent Labs    07/09/20 2346  HGBA1C 5.8*   CBG: Recent Labs  Lab 07/09/20 1922 07/09/20 2315 07/10/20 0335 07/10/20 0806 07/10/20 1133  GLUCAP 167* 199* 153* 135* 164*   Lipid Profile: No results for input(s): CHOL, HDL, LDLCALC, TRIG, CHOLHDL, LDLDIRECT in the last 72 hours. Thyroid Function Tests: No results for input(s): TSH, T4TOTAL, FREET4, T3FREE, THYROIDAB in the last 72 hours. Anemia Panel: Recent Labs    07/08/20 0205  FERRITIN 630*  TIBC 134*  IRON 23*   Sepsis Labs: Recent Labs  Lab 07/07/20 1459 07/08/20 0205  PROCALCITON  --  86.33  LATICACIDVEN 0.7  --     Recent Results (from the past 240 hour(s))  Blood culture (routine x 2)     Status: None (Preliminary result)   Collection Time: 07/07/20  2:17 PM   Specimen: BLOOD  Result Value Ref Range Status   Specimen Description BLOOD RIGHT ANTECUBITAL  Final   Special Requests   Final    BOTTLES DRAWN AEROBIC AND ANAEROBIC Blood Culture results may not be optimal due to an excessive volume of blood received in culture bottles    Culture   Final    NO GROWTH 2 DAYS Performed at [facility] [address] [place], [place] [address]    Report Status PENDING  Incomplete  Resp Panel by RT-PCR (Flu A&B, Covid) Nasopharyngeal Swab     Status: None   Collection Time: 07/07/20  2:34 PM   Specimen: Nasopharyngeal Swab; Nasopharyngeal(NP) swabs in vial transport medium  Result Value Ref Range Status   SARS Coronavirus 2 by RT PCR NEGATIVE NEGATIVE Final    Comment: (NOTE) SARS-CoV-2 target nucleic acids are NOT DETECTED.  The SARS-CoV-2 RNA is generally detectable in upper respiratory specimens during the acute phase of infection. The lowest concentration of SARS-CoV-2 viral copies this assay can detect is 138 copies/mL. A negative result does not preclude SARS-Cov-2 infection and should not be used as the sole basis for treatment or other patient management decisions. A negative  result may occur with  improper specimen collection/handling, submission of specimen other than nasopharyngeal swab, presence of viral mutation(s) within the areas targeted by this assay, and inadequate number of viral copies(<138 copies/mL). A negative result must be combined with clinical observations, patient history, and epidemiological information. The expected result is Negative.  Fact Sheet for Patients:  EntrepreneurPulse.com.au  Fact Sheet for Healthcare Providers:  IncredibleEmployment.be  This test is no t yet approved or cleared by the [place] FDA and  has been authorized for detection and/or diagnosis of SARS-CoV-2 by FDA under an Emergency Use Authorization (EUA). This EUA will remain  in effect (meaning this test can be used) for the duration of the COVID-19 declaration under Section 564(b)(1) of the Act, 21 U.S.C.section 360bbb-3(b)(1), unless the authorization is terminated  or revoked sooner.       Influenza A by PCR NEGATIVE NEGATIVE Final   Influenza B by PCR  NEGATIVE NEGATIVE Final    Comment: (NOTE) The Xpert Xpress SARS-CoV-2/FLU/RSV plus assay is intended as an aid in the diagnosis of influenza from Nasopharyngeal swab specimens and should not be used as a sole basis for treatment. Nasal washings and aspirates are unacceptable for Xpert Xpress SARS-CoV-2/FLU/RSV testing.  Fact Sheet for Patients: EntrepreneurPulse.com.au  Fact Sheet for Healthcare Providers: IncredibleEmployment.be  This test is not yet approved or cleared by the [place] FDA and has been authorized for detection and/or diagnosis of SARS-CoV-2 by FDA under an Emergency Use Authorization (EUA). This EUA will remain in effect (meaning this test can be used) for the duration of the COVID-19 declaration under Section 564(b)(1) of the Act, 21 U.S.C. section 360bbb-3(b)(1), unless the authorization is terminated or revoked.  Performed at [facility], [place] [address] [place], [place] [address]   MRSA PCR Screening     Status: None   Collection Time: 07/07/20 10:12 PM   Specimen: Nasal Mucosa; Nasopharyngeal  Result Value Ref Range Status   MRSA by PCR NEGATIVE NEGATIVE Final    Comment:        The GeneXpert MRSA Assay (FDA approved for NASAL specimens only), is one component of a comprehensive MRSA colonization surveillance program. It is not intended to diagnose MRSA infection nor to guide or monitor treatment for MRSA infections. Performed at [facility], [place] [address], [place], [place] [address]   Culture, Respiratory w Gram Stain     Status: None (Preliminary result)   Collection Time: 07/09/20 10:55 AM   Specimen: PATH Cytology washing; Body Fluid  Result Value Ref Range Status   Specimen Description BRONCHIAL ALVEOLAR LAVAGE  Final   Special Requests RLL BAL SPEC A  Final   Gram Stain NO WBC SEEN NO ORGANISMS SEEN   Final   Culture   Final    NO GROWTH < 24 HOURS Performed at [facility], [address] [place], [place] [address]    Report Status PENDING  Incomplete  Anaerobic culture w Gram Stain     Status: None (Preliminary result)   Collection Time: 07/09/20 10:55 AM   Specimen: Bronchoalveolar Lavage  Result Value Ref Range Status   Specimen Description BRONCHIAL ALVEOLAR LAVAGE  Final   Special Requests RLL SPEC A  Final   Gram Stain   Final    NO WBC SEEN NO ORGANISMS SEEN Performed at [facility], [place] [address], [place], [place] [address]    Culture PENDING  Incomplete   Report Status PENDING  Incomplete  Anaerobic culture w Gram Stain     Status: None (Preliminary result)   Collection Time: 07/09/20 10:58 AM   Specimen: Bronchoalveolar Lavage  Result Value Ref Range Status   Specimen Description BRONCHIAL ALVEOLAR LAVAGE  Final   Special Requests LLL SPEC C  Final   Gram Stain   Final    RARE WBC PRESENT, PREDOMINANTLY MONONUCLEAR NO ORGANISMS SEEN Performed at [facility], [place] [address], [place], [place] [address]    Culture PENDING  Incomplete   Report Status PENDING  Incomplete  Anaerobic culture w Gram Stain     Status: None (Preliminary result)   Collection Time: 07/09/20 10:59 AM   Specimen: PATH Cytology brushing; Body Fluid  Result Value Ref Range Status   Specimen Description FLUID  Final   Special Requests LLL BRUSHING SPEC D  Final   Gram Stain   Final    NO WBC SEEN NO ORGANISMS SEEN Performed at [facility], [address] [place], [place] [address]    Culture PENDING  Incomplete   Report Status PENDING  Incomplete  Culture, BAL-quantitative w Gram Stain     Status: None (Preliminary result)   Collection Time: 07/09/20 10:59 AM   Specimen: Bronchial Brush  Result Value Ref Range Status   Specimen Description BRONCHIAL BRUSHING  Final   Special Requests LLL BRUSHING SPEC D  Final   Gram Stain NO WBC SEEN NO ORGANISMS SEEN   Final   Culture   Final    NO GROWTH < 24 HOURS Performed at [facility], [place] [address], [place], [place] [address]    Report Status PENDING  Incomplete  Anaerobic culture w Gram Stain     Status: None (Preliminary result)   Collection Time: 07/09/20  1:04 PM   Specimen: PATH Cytology brushing; Body Fluid  Result Value Ref Range Status   Specimen Description FLUID  Final   Special Requests RLL BRUSHING SPEC B  Final   Gram Stain   Final    NO WBC SEEN NO ORGANISMS SEEN Performed at [facility], [address] [place], [place] [address]    Culture PENDING  Incomplete   Report Status PENDING  Incomplete  Culture, BAL-quantitative w Gram Stain     Status: None (Preliminary result)   Collection Time: 07/09/20  1:04 PM   Specimen: Bronchial Brush  Result Value Ref Range Status   Specimen Description BRONCHIAL BRUSHING  Final   Special Requests RLL SPEC B  Final   Gram Stain NO WBC SEEN NO ORGANISMS SEEN   Final   Culture   Final    NO GROWTH < 24 HOURS Performed at [facility], [place] [address] [place], [place] [address]    Report Status PENDING  Incomplete  Culture, Respiratory w Gram Stain     Status: None (Preliminary result)   Collection Time: 07/09/20  1:31 PM   Specimen: PATH Cytology washing; Body Fluid  Result Value Ref Range Status   Specimen Description BRONCHIAL ALVEOLAR LAVAGE  Final   Special Requests LLL SPEC C  Final   Gram Stain   Final    RARE WBC PRESENT, PREDOMINANTLY MONONUCLEAR NO ORGANISMS SEEN    Culture   Final    NO GROWTH < 24 HOURS Performed at [facility] [address] [place], [place] [address]    Report Status PENDING  Incomplete         Radiology Studies:  DG Chest 1 View  Result Date: 07/09/2020 CLINICAL DATA:  Respiratory distress syndrome EXAM: CHEST  1 VIEW COMPARISON:  07/07/2020 FINDINGS: Stable cardiomediastinal contours. Continued progression of bilateral airspace opacities, most confluent within the lung bases. Small bilateral pleural effusions. No pneumothorax. IMPRESSION:  Continued progression of bilateral airspace opacities, most confluent within the lung bases. Electronically Signed   By: Davina Poke D.O.   On: 07/09/2020 12:48        Scheduled Meds: . (feeding supplement) PROSource Plus  30 mL Oral BID BM  . acidophilus  1 capsule Oral Daily  . acyclovir  400 mg Oral Daily  . amLODipine  2.5 mg Oral Daily  . benzonatate  200 mg Oral BID  . buPROPion  100 mg Oral Daily  . Chlorhexidine Gluconate Cloth  6 each Topical Q0600  . escitalopram  20 mg Oral Daily  . feeding supplement (NEPRO CARB STEADY)  237 mL Oral BID BM  . guaiFENesin  1,200 mg Oral BID  . heparin  5,000 Units Subcutaneous Q12H  . insulin aspart  0-15 Units Subcutaneous TID WC  . insulin aspart  0-5 Units Subcutaneous QHS  . ipratropium-albuterol  3 mL Nebulization QID  . mouth rinse  15 mL Mouth Rinse BID  . multivitamin  1 tablet Oral QHS  . predniSONE  5 mg Oral Q breakfast  . sevelamer carbonate  800 mg Oral TID WC  . simvastatin  10 mg Oral q1800  . Sirolimus  2 mg Oral Daily   Continuous Infusions: . ceFEPime (MAXIPIME) IV       LOS: 3 days    Time spent: 30 minutes    Barb Merino, MD [facility] Pager [phone]

## 2020-07-10 NOTE — Progress Notes (Signed)
NAME:  Kaitlyn Schwartz, MRN:  [id number], DOB:  Apr 25, 1986, LOS: 3 ADMISSION DATE:  07/07/2020, CONSULTATION DATE:  07/08/2020 REFERRING MD:  Dr. Darrick Meigs, CHIEF COMPLAINT:  Fever, SOB  Brief History:  35 yoF  immunosuppressed patient s/p partially failed renal transplant on ESRD, presenting with fever, hypoxia and ongoing multifocal PNA on chest CT.  Recent admit for same at [facility] and outpatient treatment for ongoing pneumonia.  Admitted by [facility].  Pulmonary consulted for further evaluation.   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    Recently dmitted at [facility] 2/25- 2/28 and treated for CAP/ multifocal pneumonia, right greater than left.  Neg RVP panel.  Noted to be treated inpatient with ceftriaxone and azithro however patient unable to tolerate azithro secondary to nausea and was discontinued.  Discharged home on Augmentin.  Completed course however but then developed SOB again with nausea, vomiting, and diarrhea.  Evaluated in ER on 3/6 with CT abdomen concerning for possibly colitis (felt likely secondary to her recent abx use) and CXR still showing patchy bilateral airspace opacities.  Negative for COVID/ flu.  She was sent home on 10 course of doxycycline with plans to f/u with PCP.  However she was unable to tolerate doxycycline course secondary to nausea and took two day course.   Of note, has remained on iHD for the last 5 years given partial rejection and remained on immunosuppressant medications including tacrolimus and prednisone at the recommendations of her nephrologist.  She remains on the renal transplant list.  Presented back to the ER on 3/10, sent from her PCP office where she has  ongoing exertional dyspnea, fever of 101, and found to be hypoxic on room air at 75%.  She was unable to have a full iHD session on 3/9.   Reports some nasal congestion and rhinorrhea, productive cough with dark brown sputum, denies hemoptysis.   In ER, patient afebrile, hemodynamically stable and requiring 4L [place].  Labs noted for normal WBC, Hgb 10-> 8.3, sCr 7.83, K 3.6, normal lactic acid, neg troponin, neg COVID/ flu, CXR showing progression of right upper lobe infiltrate and unchanged extensive infiltrates in the lower lobes.  CTA PE was negative for PE but did show multifocal pneumonia.  She was admitted to [facility] and started on vancomycin and cefepime coverage.  Pulmonary consulted for further recommendations.   Symptoms began 3 weeks ago, dry cough, DOE, subjective fevers, malaise.  Worsening SOB over past 2 weeks.  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  Micro Data:  3/10 SARS/ flu >> neg 3/10 BCx >> neg 3/10 MRSA >> 3/13 Antimicrobials:  3/10 cefepime >> 3/10 vanc >>3/13  Interim History / Subjective:  S/p bronch on 0/16 complicated by desaturations. Component of anxiety which improved with Precedex. Precedex weaned off. She appear comfortable. Wearing NRB.  Objective    Blood pressure 119/79, pulse 99, temperature 98.2 F (36.8 C), temperature source Axillary, resp. rate (!) 27, height _0  (1.575 m), weight 75 kg, last menstrual period 06/29/2020, SpO2 95 %.        Intake/Output Summary (Last 24 hours) at 07/10/2020 0741 Last data filed at 07/10/2020 0600 Gross per 24 hour  Intake 328.71 ml  Output -  Net 328.71 ml   Filed Weights   07/07/20 1354 07/09/20 1003 07/09/20 1300  Weight: 73.9 kg 73.9 kg 75 kg   Physical Exam: General: Well-appearing, no acute distress HENT: [place], AT, OP clear, MMM, wearing NRB Eyes: EOMI, no scleral icterus Respiratory: Clear to auscultation bilaterally.  No crackles, wheezing or rales Cardiovascular: RRR, -M/R/G, no JVD Extremities:-Edema,-tenderness Neuro: AAO x4, CNII-XII grossly intact Skin: Intact, no rashes or bruising Psych: Normal mood, normal affect  Resolved Hospital Problem list    Assessment & Plan:   Multifocal PNA in the immunosuppressed patient: opportunistic vs. HCAP vs. Inflammatory vs. Drug-associated; severely elevated Pct argues for bacterial BAL with lymphocytic predominance. Culture neg in RLL and LLL Acute on chronic hypoxic respiratory failure - worsened post-op - Weaned off Precedex - Transition from NRB to nasal cannula and wean for goal SpO2 >92%. Once on nasal cannula, OK to transfer from ICU - Continue cefepime. DC Vanc - F/u final cultures from BAL - DC steroids due to improving clinical status  ESRD on MWF iHD S/p kidney transplant with partial rejection - continue anti-rejection meds - iHD per nephrology  Hx CMV infection - daily acyclovir  Best practice (evaluated daily)  Diet: Regular diet Pain/Anxiety/Delirium protocol (if indicated): n/a VAP protocol (if indicated): per primary DVT prophylaxis: : per primary GI prophylaxis: : per primary Glucose control: : per primary Mobility: : per primary Disposition:: per primary  Goals of Care:  n/a  The patient is  critically ill and requires high complexity decision making for assessment and support, frequent evaluation and titration of therapies, application of advanced monitoring technologies and extensive interpretation of multiple databases.  Independent Critical Care Time: 31 Minutes.   Rodman Pickle, M.D. [facility] Pulmonary/Critical Care Medicine 07/10/2020 7:59 AM   Please see Amion for pager number to reach on-call Pulmonary and Critical Care Team.

## 2020-07-10 NOTE — Progress Notes (Signed)
Pharmacy Antibiotic Note  Kaitlyn Schwartz is a 35 y.o. female admitted on 07/07/2020 with pneumonia s./p bronch. Patient has history of ESRD on HD MWF and kidney transplant. Pharmacy has been consulted for cefepime dosing. Patient has been receiving vancomycin 750mg  with iHD MWF and cefepime 1g q24h. No organisms seen on bronch gram stain. MRSA PCR negative.   Plan: Continue cefepime 1g q24h  Stopping vancomycin  Follow up bronch cultures and length of therapy    Height: 5\' 2"  (157.5 cm) Weight: 75 kg (165 lb 5.5 oz) IBW/kg (Calculated) : 50.1  Temp (24hrs), Avg:97.8 F (36.6 C), Min:97.2 F (36.2 C), Max:98.2 F (36.8 C)  Recent Labs  Lab 07/03/20 1022 07/07/20 1411 07/07/20 1459 07/08/20 0205 07/09/20 0055  WBC 5.8 8.4  --  7.2 8.4  CREATININE 9.88* 7.83*  --  9.31* 4.47*  LATICACIDVEN  --   --  0.7  --   --     Estimated Creatinine Clearance: 16.7 mL/min (A) (by C-G formula based on SCr of 4.47 mg/dL (H)).    Allergies  Allergen Reactions  . Lisinopril Shortness Of Breath    Other reaction(s): Cough   . Vancomycin Itching  . Versed [Midazolam] Anxiety  . Azithromycin     IV only  . Cellcept [Mycophenolate] Diarrhea  . Dobutamine     Other reaction(s): HTN, bradycardia  . Ibuprofen     Other reaction(s): *Unknown Pt was told not to take ibuprofen after kidney transplant - Sep 10, 2003    Antimicrobials this admission: Vancomycin 3/10 > 3/13 Cefepime 3/10 >>  Dose adjustments this admission: n/a  Microbiology results: 3/10 Bcx: ngtd 3/13 bronch: no organisms on gram stain; pending MRSA PCR negative   Thank you for allowing pharmacy to be a part of this patient's care.  Cristela Felt, PharmD Clinical Pharmacist  07/10/2020 7:51 AM

## 2020-07-10 NOTE — Progress Notes (Signed)
Nephrology Follow-Up Consult note   Assessment/Recommendations: Kaitlyn Schwartz is a/an 35 y.o. female with a past medical history significant for ESRD on HD admitted with multifocal pneumonia.   # ESRD: Will need to obtain outpatient prescription from [facility].  Continue dialysis on MWF schedule.  Prescription for now: 3hrs, 3K, 2.5Ca, Na 137, Dialyzer: F180.  Stop patient's home sodium bicarbonate # Volume/ hypertension:  Maintain EDW.  BP acceptable.  Can continue norvasc. Hold losartan for now # Anemia of Chronic Kidney Disease: Hemoglobin 8.6.  Iron saturation 17 and ferritin 630.  Will consider iron once the patient's infection improves.  Consider ESA based on outpatient records # Secondary Hyperparathyroidism/Hyperphosphatemia:  Continue sevelamer 3 times daily # Vascular access: Left upper extremity aVF with no issues #Multifocal pneumonia: Being treated with cefepime and vancomycin.    Follow-up bronchoscopy cultures #History of kidney transplant: Now on dialysis but continue home sirolimus 2 mg daily and prednisone 5 mg daily  # Additional recommendations: - Dose all meds for creatinine clearance <10 ml/min  - Unless absolutely necessary, no MRIs with gadolinium.  - Implement save arm precautions. Prefer needle sticks in the dorsum of the hands or wrists. No blood pressure measurements in arm. - If blood transfusion is requested during hemodialysis sessions, please alert [place] prior to the session.     Recommendations conveyed to primary service.    [facility] 07/10/2020 9:55 AM  ___________________________________________________________  CC: ESRD  Interval History/Subjective: Patient tolerated procedure yesterday.  States she feels slightly better today.  Went to ICU afterwards because of some intermittent hypoxia.  Significant amount of mucus found.   Medications:  Current Facility-Administered Medications  Medication Dose Route  Frequency Provider Last Rate Last Admin  . (feeding supplement) PROSource Plus liquid 30 mL  30 mL Oral BID BM Ghimire, Kuber, MD      . acetaminophen (TYLENOL) tablet 650 mg  650 mg Oral Q4H PRN Vernelle Emerald, MD   650 mg at 07/09/20 0149  . acidophilus (RISAQUAD) capsule 1 capsule  1 capsule Oral Daily Wynetta Fines T, MD   1 capsule at 07/10/20 0835  . acyclovir (ZOVIRAX) tablet 400 mg  400 mg Oral Daily Wynetta Fines T, MD   400 mg at 07/10/20 0835  . amLODipine (NORVASC) tablet 2.5 mg  2.5 mg Oral Daily Wynetta Fines T, MD   2.5 mg at 07/10/20 0836  . benzonatate (TESSALON) capsule 200 mg  200 mg Oral BID Wynetta Fines T, MD   200 mg at 07/10/20 0836  . buPROPion [facility]) tablet 100 mg  100 mg Oral Daily Wynetta Fines T, MD   100 mg at 07/10/20 0836  . ceFEPIme (MAXIPIME) 1 g in sodium chloride 0.9 % 100 mL IVPB  1 g Intravenous Q24H Barb Merino, MD      . Chlorhexidine Gluconate Cloth 2 % PADS 6 each  6 each Topical F7510 Reesa Chew, MD   6 each at 07/09/20 2052  . escitalopram (LEXAPRO) tablet 20 mg  20 mg Oral Daily Wynetta Fines T, MD   20 mg at 07/10/20 0836  . feeding supplement (NEPRO CARB STEADY) liquid 237 mL  237 mL Oral BID BM Wynetta Fines T, MD      . guaiFENesin [facility]) 12 hr tablet 1,200 mg  1,200 mg Oral BID Wynetta Fines T, MD   1,200 mg at 07/10/20 0836  . heparin injection 5,000 Units  5,000 Units Subcutaneous Q12H Lequita Halt, MD  5,000 Units at 07/10/20 0910  . insulin aspart (novoLOG) injection 0-15 Units  0-15 Units Subcutaneous TID WC Stretch, Marily Lente, MD      . insulin aspart (novoLOG) injection 0-5 Units  0-5 Units Subcutaneous QHS Stretch, Marily Lente, MD      . ipratropium-albuterol (DUONEB) 0.5-2.5 (3) MG/3ML nebulizer solution 3 mL  3 mL Nebulization Q6H PRN Darrick Meigs, Marge Duncans, MD      . ipratropium-albuterol (DUONEB) 0.5-2.5 (3) MG/3ML nebulizer solution 3 mL  3 mL Nebulization QID Candee Furbish, MD   3 mL at 07/10/20 0827  . LORazepam (ATIVAN) injection 2 mg   2 mg Intravenous Q4H PRN Candee Furbish, MD      . MEDLINE mouth rinse  15 mL Mouth Rinse BID Wynetta Fines T, MD   15 mL at 07/09/20 2140  . multivitamin (RENA-VIT) tablet 1 tablet  1 tablet Oral QHS Barb Merino, MD   1 tablet at 07/09/20 2120  . ondansetron (ZOFRAN) injection 4 mg  4 mg Intravenous Q6H PRN Barb Merino, MD      . predniSONE (DELTASONE) tablet 5 mg  5 mg Oral Q breakfast Margaretha Seeds, MD   5 mg at 07/10/20 0910  . sevelamer carbonate (RENVELA) tablet 800 mg  800 mg Oral TID WC Reesa Chew, MD   800 mg at 07/10/20 0910  . simvastatin (ZOCOR) tablet 10 mg  10 mg Oral q1800 Wynetta Fines T, MD   10 mg at 07/09/20 1835  . sirolimus (RAPAMUNE) tablet 2 mg  2 mg Oral Daily Wynetta Fines T, MD   2 mg at 07/10/20 0835  . sodium bicarbonate tablet 1,300 mg  1,300 mg Oral Daily Wynetta Fines T, MD   1,300 mg at 07/10/20 0349      Review of Systems: 10 systems reviewed and negative except per interval history/subjective  Physical Exam: Vitals:   07/10/20 0828 07/10/20 0900  BP: 108/88 (!) 138/92  Pulse: (!) 110 (!) 111  Resp: (!) 31 (!) 30  Temp:    SpO2: 92% 96%   Total I/O In: 100 [P.O.:100] Out: -   Intake/Output Summary (Last 24 hours) at 07/10/2020 0955 Last data filed at 07/10/2020 0900 Gross per 24 hour  Intake 428.71 ml  Output --  Net 428.71 ml   Constitutional: Ill-appearing, lying in bed, no distress ENMT: ears and nose without scars or lesions, MMM CV: normal rate, no edema Respiratory: Increased work of breathing, bilateral chest rise Gastrointestinal: soft, non-tender, no palpable masses or hernias Skin: no visible lesions or rashes Psych: alert, judgement/insight appropriate, appropriate mood and affect   Test Results I personally reviewed new and old clinical labs and radiology tests Lab Results  Component Value Date   NA 138 07/09/2020   K 4.0 07/09/2020   CL 99 07/09/2020   CO2 29 07/09/2020   BUN 5 (L) 07/09/2020   CREATININE 4.47  (H) 07/09/2020   CALCIUM 9.1 07/09/2020   ALBUMIN 3.3 (L) 07/03/2020   PHOS 5.0 (H) 07/08/2020

## 2020-07-11 ENCOUNTER — Encounter (HOSPITAL_COMMUNITY): Payer: Self-pay | Admitting: Internal Medicine

## 2020-07-11 DIAGNOSIS — J189 Pneumonia, unspecified organism: Secondary | ICD-10-CM | POA: Diagnosis not present

## 2020-07-11 LAB — HEPATITIS B SURFACE ANTIBODY,QUALITATIVE: Hep B S Ab: REACTIVE — AB

## 2020-07-11 LAB — CULTURE, RESPIRATORY W GRAM STAIN
Culture: NO GROWTH
Culture: NO GROWTH
Gram Stain: NONE SEEN

## 2020-07-11 LAB — CULTURE, BAL-QUANTITATIVE W GRAM STAIN
Culture: NO GROWTH
Culture: NO GROWTH
Gram Stain: NONE SEEN
Gram Stain: NONE SEEN

## 2020-07-11 LAB — GLUCOSE, CAPILLARY: Glucose-Capillary: 124 mg/dL — ABNORMAL HIGH (ref 70–99)

## 2020-07-11 LAB — HEPATITIS B SURFACE ANTIGEN: Hepatitis B Surface Ag: NONREACTIVE

## 2020-07-11 LAB — MYCOPLASMA PNEUMONIAE ANTIBODY, IGM: Mycoplasma pneumo IgM: <770 U/mL (ref 0–769)

## 2020-07-11 LAB — HEPATITIS B CORE ANTIBODY, TOTAL: Hep B Core Total Ab: NONREACTIVE

## 2020-07-11 MED ORDER — DARBEPOETIN ALFA 60 MCG/0.3ML IJ SOSY
60.0000 ug | PREFILLED_SYRINGE | INTRAMUSCULAR | Status: DC
  Administered 2020-07-11: 60 ug via SUBCUTANEOUS
  Filled 2020-07-11: qty 0.3

## 2020-07-11 MED ORDER — DARBEPOETIN ALFA 60 MCG/0.3ML IJ SOSY: 60.0000 ug | PREFILLED_SYRINGE | INTRAMUSCULAR | Status: DC

## 2020-07-11 MED ORDER — BISACODYL 10 MG RE SUPP: 10.0000 mg | Freq: Every day | RECTAL | Status: DC | PRN

## 2020-07-11 MED ORDER — DARBEPOETIN ALFA 60 MCG/0.3ML IJ SOSY
60.0000 ug | PREFILLED_SYRINGE | INTRAMUSCULAR | Status: DC
  Filled 2020-07-11: qty 0.3

## 2020-07-11 MED ORDER — SENNOSIDES-DOCUSATE SODIUM 8.6-50 MG PO TABS
1.0000 | ORAL_TABLET | Freq: Every day | ORAL | Status: DC
  Administered 2020-07-11 – 2020-07-16 (×6): 1 via ORAL
  Filled 2020-07-11 (×6): qty 1

## 2020-07-11 NOTE — Progress Notes (Signed)
NAME:  Kaitlyn Schwartz, MRN:  [id number], DOB:  [birth year], LOS: 4 ADMISSION DATE:  07/07/2020, CONSULTATION DATE:  07/08/2020 REFERRING MD:  Dr. Darrick Meigs, CHIEF COMPLAINT:  Fever, SOB  Brief History:  1 yoF  immunosuppressed patient s/p partially failed renal transplant on ESRD, presenting with fever, hypoxia and ongoing multifocal PNA on chest CT.  Recent admit for same at [facility] and outpatient treatment for ongoing pneumonia.  Admitted by [facility].  Pulmonary consulted for further evaluation.   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    Recently dmitted at [facility] 2/25- 2/28 and treated for CAP/ multifocal pneumonia, right greater than left.  Neg RVP panel.  Noted to be treated inpatient with ceftriaxone and azithro however patient unable to tolerate azithro secondary to nausea and was discontinued.  Discharged home on Augmentin.  Completed course however but then developed SOB again with nausea, vomiting, and diarrhea.  Evaluated in ER on 3/6 with CT abdomen concerning for possibly colitis (felt likely secondary to her recent abx use) and CXR still showing patchy bilateral airspace opacities.  Negative for COVID/ flu.  She was sent home on 10 course of doxycycline with plans to f/u with PCP.  However she was unable to tolerate doxycycline course secondary to nausea and took two day course.   Of note, has remained on iHD for the last 5 years given partial rejection and remained on immunosuppressant medications including tacrolimus and prednisone at the recommendations of her nephrologist.  She remains on the renal transplant list.  Presented back to the ER on 3/10, sent from her PCP office where she has  ongoing exertional dyspnea, fever of 101, and found to be hypoxic on room air at 75%.  She was unable to have a full iHD session on 3/9.   Reports some nasal congestion and rhinorrhea, productive cough with dark brown sputum, denies hemoptysis.   In ER, patient afebrile, hemodynamically stable and requiring 4L [place].  Labs noted for normal WBC, Hgb 10-> 8.3, sCr 7.83, K 3.6, normal lactic acid, neg troponin, neg COVID/ flu, CXR showing progression of right upper lobe infiltrate and unchanged extensive infiltrates in the lower lobes.  CTA PE was negative for PE but did show multifocal pneumonia.  She was admitted to [facility] and started on vancomycin and cefepime coverage.  Pulmonary consulted for further recommendations.   Symptoms began 3 weeks ago, dry cough, DOE, subjective fevers, malaise.  Worsening SOB over past 2 weeks.  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  Micro Data:  3/10 SARS/ flu >> neg 3/10 BCx >> neg 3/10 MRSA >> neg 3/12 BAL  bacterial >>  3/12 BAL AFB >>  3/12 BAL fungal >>  3/12 TBBx tissue cx's >    Antimicrobials:  3/10 cefepime >> 3/10 vanc >>3/13  Interim History / Subjective:   Anxiety improved, Precedex off No positive culture data from bronchoscopy yet.   Does not look like PJP was sent 11 L/min nasal cannula  Objective   Blood pressure (!) 141/98, pulse (!) 123, temperature 98 F (36.7 C), temperature source Oral, resp. rate (!) 22, height _0  (1.575 m), weight 75 kg, last menstrual period 06/29/2020, SpO2 (!) 89 %.    FiO2 (%):  [40 %] 40 %   Intake/Output Summary (Last 24 hours) at 07/11/2020 1038 Last data filed at 07/10/2020 2200 Gross per 24 hour  Intake 150 ml  Output --  Net 150 ml   Filed Weights   07/07/20 1354 07/09/20 1003 07/09/20 1300  Weight: 73.9 kg 73.9 kg 75 kg   Physical Exam: General: Some mild respiratory distress when she is talking, otherwise none HENT: Oropharynx clear, moist Eyes: No icterus Respiratory: Scattered bilateral inspiratory crackles, no wheezing Cardiovascular: Regular, distant, no murmur Extremities: No edema Neuro: Awake, alert, appropriate, does not appear to be particularly anxious, moves all extremities, follows commands good strength Skin: No rash  Resolved Hospital Problem list    Assessment & Plan:   Multifocal PNA in immunosuppressed patient: opportunistic vs. HCAP vs. Inflammatory vs. Drug-associated; severely elevated Pct argues for bacterial BAL with lymphocytic predominance. Culture neg in RLL and LLL so far Acute on chronic hypoxic respiratory failure - worsened post-op.  Suspect infectious/inflammatory, consider also volume status and pulmonary edema -Wean FiO2 as able, currently 11 L/min by nasal cannula.  Okay to transfer out of intensive care -Empiric cefepime for now pending culture data -Following off steroids -It does not look like PJP was sent from the BAL.  We will try to send now.  If no sample available may need to obtain sputum for PJP   ESRD on MWF iHD S/p kidney transplant with partial rejection -Continue antirejection medications -Intermittent HD per nephrology plans.  Would push volume removal if possible as some of her pulmonary infiltrates may reflect  edema  Hx CMV infection -Daily acyclovir  Best practice (evaluated daily)  Diet: Regular diet Pain/Anxiety/Delirium protocol (if indicated): n/a VAP protocol (if indicated): per primary DVT prophylaxis: : per primary GI prophylaxis: : per primary Glucose control: : per primary Mobility: : per primary Disposition:: per primary   Baltazar Apo, MD, PhD 07/11/2020, 10:48 AM [facility] [phone] or if no answer before 7:00PM call [phone] For any issues after 7:00PM please call eLink [phone]

## 2020-07-11 NOTE — Progress Notes (Signed)
Patient ID: Kaitlyn Schwartz, female   DOB: 24-May-1985, 35 y.o.   MRN: [id number] [facility] Progress Note   Assessment/ Plan:   1.  Altered focal pneumonia with acute hypoxic respiratory failure: On broad-spectrum antimicrobial therapy with vancomycin and cefepime.  She remains on supplemental oxygenation and continues to have intermittent bouts of coughing. 2. ESRD: With history of failed renal transplant and now back on renal replacement therapy with hemodialysis.  Continue this on a Monday/Wednesday/Friday schedule with dialysis ordered for later today via left forearm AV fistula.  She remains on sirolimus and low-dose prednisone. 3. Anemia: Without overt blood loss, will give intravenous iron (ferritin 630 with iron saturation 17%) and begin ESA. 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 5. Nutrition: Continue renal diet with fluid restriction, on renal multivitamin. 6. Hypertension: Blood pressure within acceptable range, continue to monitor with hemodialysis/UF.  Subjective:   Bothered by intermittent coughing and had poor experience with her last dialysis treatment because of fever/posttussive vomiting.   Objective:   BP 107/86   Pulse (!) 107   Temp 98 F (36.7 C) (Oral)   Resp (!) 24   Ht 5\' 2"  (1.575 m)   Wt 75 kg   LMP 06/29/2020   SpO2 92%   BMI 30.24 kg/m   Physical Exam: Gen: Comfortably resting in bed, intermittently coughing CVS: Pulse regular tachycardia, S1 and S2 normal Resp: Coarse breath sounds bilaterally without distinct rales or rhonchi Abd: Soft, obese, nontender Ext: Trace lower extremity edema, left forearm RCF with poor thrill/suboptimal augmentation  Labs: BMET Recent Labs  Lab 07/07/20 1411 07/08/20 0205 07/09/20 0055  NA 139 135 138  K 3.6 3.6 4.0  CL 99 97* 99  CO2 27 26 29   GLUCOSE 89 100* 84  BUN 12 17 5*  CREATININE 7.83* 9.31* 4.47*   CALCIUM 8.6* 8.0* 9.1  PHOS  --  5.0*  --    CBC Recent Labs  Lab 07/07/20 1411 07/08/20 0205 07/09/20 0055  WBC 8.4 7.2 8.4  NEUTROABS 6.0  --   --   HGB 10.0* 8.3* 8.6*  HCT 31.0* 25.7* 26.7*  MCV 90.1 91.8 89.9  PLT 513* 410* 424*     Medications:    . (feeding supplement) PROSource Plus  30 mL Oral BID BM  . acidophilus  1 capsule Oral Daily  . acyclovir  400 mg Oral Daily  . amLODipine  2.5 mg Oral Daily  . benzonatate  200 mg Oral BID  . buPROPion  100 mg Oral Daily  . Chlorhexidine Gluconate Cloth  6 each Topical Q0600  . escitalopram  20 mg Oral Daily  . feeding supplement (NEPRO CARB STEADY)  237 mL Oral BID BM  . guaiFENesin  1,200 mg Oral BID  . heparin  5,000 Units Subcutaneous Q12H  . insulin aspart  0-15 Units Subcutaneous TID WC  . insulin aspart  0-5 Units Subcutaneous QHS  . ipratropium-albuterol  3 mL Nebulization QID  . mouth rinse  15 mL Mouth Rinse BID  . multivitamin  1 tablet Oral QHS  . predniSONE  5 mg Oral Q breakfast  . sevelamer carbonate  800 mg Oral TID WC  . simvastatin  10 mg Oral q1800  . Sirolimus  2 mg Oral Daily   Elmarie Shiley, MD 07/11/2020, 9:26 AM

## 2020-07-11 NOTE — Progress Notes (Signed)
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  In the emergency room, she was 75% on room air.  CT chest showed multifocal pneumonia.     Assessment & Plan:   Active Problems:   Multifocal pneumonia  Multifocal pneumonia in immunocompromised , acute hypoxemic respiratory failure:  Patient currently remains on vancomycin and cefepime.   Chest physiotherapy, incentive spirometry, deep breathing exercises, sputum induction, mucolytic's and bronchodilators. All cultures are negative so far. Supplemental oxygen to keep saturations more than 90%. Patient is seen by PCCM and underwent bronchoscopy on 3/12, no positive growth so far. Still remains on significant nasal cannula oxygen, currently on 10 L/min however clinically improving since bronchoscopy.  Start mobilizing.  ESRD on hemodialysis: Monday Wednesday Friday dialysis.  Followed by nephrology.  Renal transplant: Patient on tacrolimus and low-dose prednisone that is continued.  Hypertension: Blood pressure stable on amlodipine and losartan.  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  Continue.   DVT prophylaxis: heparin injection 5,000 Units Start: 07/07/20 2200   Code Status: Full code Family Communication: None today. Disposition Plan: Status is: Inpatient  Remains inpatient appropriate because:IV treatments appropriate due to intensity of illness or inability to take PO and Inpatient level of care appropriate due to severity of illness   Dispo: The patient is from: Home              Anticipated  d/c is to: Home              Patient currently is not medically stable to d/c.   Difficult to place patient No         Consultants:   PCCM  Nephrology  Procedures:   None  Antimicrobials:   Vancomycin, cefepime 3/10---   Subjective: Patient seen and examined.  Denies any complaints.  She wanted to make sure that we discontinue her blood sugar checks and insulin orders.  She wants to have dialysis and then 3 times.  No other events.  Afebrile. Discussed with pulmonary.  Objective: Vitals:   07/11/20 0843 07/11/20 0900 07/11/20 1000 07/11/20 1100  BP: 107/86 (!) 141/98 137/88 121/89  Pulse: (!) 107 (!) 123 (!) 109 (!) 110  Resp: (!) 24 (!) 22 (!) 22 (!) 24  Temp:      TempSrc:      SpO2: 92% (!) 89% 98% 98%  Weight:      Height:        Intake/Output Summary (Last 24 hours) at 07/11/2020 1117 Last data filed at 07/11/2020 0800 Gross per 24 hour  Intake 250 ml  Output --  Net 250 ml   Filed Weights   07/07/20 1354 07/09/20 1003 07/09/20 1300  Weight: 73.9 kg 73.9 kg 75 kg    Examination:  General exam: Looks fairly comfortable.  In mild distress on talking.  Able to talk in full sentences.  Respiratory system: Mostly clear.  Poor air entry at bases. Cardiovascular system: S1 & S2 heard, regular rate rhythm. Gastrointestinal system: Abdomen is nondistended, soft and nontender. No organomegaly or masses felt. Normal bowel sounds heard. Central nervous system: Alert and oriented.  No focal neurological deficits. Extremities: Symmetric 5 x 5 power. Left forearm with AV fistula.    Data Reviewed: I have personally reviewed following labs and imaging studies  CBC: Recent Labs  Lab 07/07/20 1411 07/08/20 0205 07/09/20 0055  WBC 8.4 7.2 8.4  NEUTROABS 6.0  --   --   HGB 10.0* 8.3* 8.6*  HCT 31.0* 25.7* 26.7*  MCV 90.1 91.8 89.9  PLT 513* 410* 161*   Basic Metabolic Panel: Recent Labs  Lab 07/07/20 1411 07/08/20 0205 07/09/20 0055  NA 139 135 138   K 3.6 3.6 4.0  CL 99 97* 99  CO2 _0 GLUCOSE 89 100* 84  BUN 12 17 5*  CREATININE 7.83* 9.31* 4.47*  CALCIUM 8.6* 8.0* 9.1  PHOS  --  5.0*  --    GFR: Estimated Creatinine Clearance: 16.7 mL/min (A) (by C-G formula based on SCr of 4.47 mg/dL (H)). Liver Function Tests: No results for input(s): AST, ALT, ALKPHOS, BILITOT, PROT, ALBUMIN in the last 168 hours. No results for input(s): LIPASE, AMYLASE in the last 168 hours. No results for input(s): AMMONIA in the last 168 hours. Coagulation Profile: No results for input(s): INR, PROTIME in the last 168 hours. Cardiac Enzymes: No results for input(s): CKTOTAL, CKMB, CKMBINDEX, TROPONINI in the last 168 hours. BNP (last 3 results) No results for input(s): PROBNP in the last 8760 hours. HbA1C: Recent Labs    07/09/20 2346  HGBA1C 5.8*   CBG: Recent Labs  Lab 07/10/20 0806 07/10/20 1133 07/10/20 1703 07/10/20 2134 07/11/20 0724  GLUCAP 135* 164* 135* 151* 124*   Lipid Profile: No results for input(s): CHOL, HDL, LDLCALC, TRIG, CHOLHDL, LDLDIRECT in the last 72 hours. Thyroid Function Tests: No results for input(s): TSH, T4TOTAL, FREET4, T3FREE, THYROIDAB in the last 72 hours. Anemia Panel: No results for input(s): VITAMINB12, FOLATE, FERRITIN, TIBC, IRON, RETICCTPCT in the last 72 hours. Sepsis Labs: Recent Labs  Lab 07/07/20 1459 07/08/20 0205  PROCALCITON  --  86.33  LATICACIDVEN 0.7  --     Recent Results (from the past 240 hour(s))  Blood culture (routine x 2)     Status: None (Preliminary result)   Collection Time: 07/07/20  2:17 PM   Specimen: BLOOD  Result Value Ref Range Status   Specimen Description BLOOD RIGHT ANTECUBITAL  Final   Special Requests   Final    BOTTLES DRAWN AEROBIC AND ANAEROBIC Blood Culture results may not be optimal due to an excessive volume of blood received in culture bottles   Culture   Final    NO GROWTH 4 DAYS Performed at [facility] [address]  [place], [place] [address]    Report Status PENDING  Incomplete  Resp Panel by RT-PCR (Flu A&B, Covid) Nasopharyngeal Swab     Status: None   Collection Time: 07/07/20  2:34 PM   Specimen: Nasopharyngeal Swab; Nasopharyngeal(NP) swabs in vial transport medium  Result Value Ref Range Status   SARS Coronavirus 2 by RT PCR NEGATIVE NEGATIVE Final    Comment: (NOTE) SARS-CoV-2 target nucleic acids are NOT DETECTED.  The SARS-CoV-2 RNA is generally detectable in upper respiratory specimens during the acute phase of infection. The lowest concentration of SARS-CoV-2 viral copies this assay can detect is 138 copies/mL. A negative result does not preclude SARS-Cov-2 infection and should not be used as the sole basis for treatment or other patient management decisions. A negative result may occur with  improper specimen collection/handling, submission of  specimen other than nasopharyngeal swab, presence of viral mutation(s) within the areas targeted by this assay, and inadequate number of viral copies(<138 copies/mL). A negative result must be combined with clinical observations, patient history, and epidemiological information. The expected result is Negative.  Fact Sheet for Patients:  EntrepreneurPulse.com.au  Fact Sheet for Healthcare Providers:  IncredibleEmployment.be  This test is no t yet approved or cleared by the [place] FDA and  has been authorized for detection and/or diagnosis of SARS-CoV-2 by FDA under an Emergency Use Authorization (EUA). This EUA will remain  in effect (meaning this test can be used) for the duration of the COVID-19 declaration under Section 564(b)(1) of the Act, 21 U.S.C.section 360bbb-3(b)(1), unless the authorization is terminated  or revoked sooner.       Influenza A by PCR NEGATIVE NEGATIVE Final   Influenza B by PCR NEGATIVE NEGATIVE Final    Comment: (NOTE) The Xpert Xpress SARS-CoV-2/FLU/RSV plus assay is  intended as an aid in the diagnosis of influenza from Nasopharyngeal swab specimens and should not be used as a sole basis for treatment. Nasal washings and aspirates are unacceptable for Xpert Xpress SARS-CoV-2/FLU/RSV testing.  Fact Sheet for Patients: EntrepreneurPulse.com.au  Fact Sheet for Healthcare Providers: IncredibleEmployment.be  This test is not yet approved or cleared by the [place] FDA and has been authorized for detection and/or diagnosis of SARS-CoV-2 by FDA under an Emergency Use Authorization (EUA). This EUA will remain in effect (meaning this test can be used) for the duration of the COVID-19 declaration under Section 564(b)(1) of the Act, 21 U.S.C. section 360bbb-3(b)(1), unless the authorization is terminated or revoked.  Performed at [facility], [place] [address], [place], [place] [address]   MRSA PCR Screening     Status: None   Collection Time: 07/07/20 10:12 PM   Specimen: Nasal Mucosa; Nasopharyngeal  Result Value Ref Range Status   MRSA by PCR NEGATIVE NEGATIVE Final    Comment:        The GeneXpert MRSA Assay (FDA approved for NASAL specimens only), is one component of a comprehensive MRSA colonization surveillance program. It is not intended to diagnose MRSA infection nor to guide or monitor treatment for MRSA infections. Performed at [facility], [place] [address] [place], [place] [address]   Culture, Respiratory w Gram Stain     Status: None (Preliminary result)   Collection Time: 07/09/20 10:55 AM   Specimen: PATH Cytology washing; Body Fluid  Result Value Ref Range Status   Specimen Description BRONCHIAL ALVEOLAR LAVAGE  Final   Special Requests RLL BAL SPEC A  Final   Gram Stain NO WBC SEEN NO ORGANISMS SEEN   Final   Culture   Final    NO GROWTH < 24 HOURS Performed at [facility], [address], [place],  [address]    Report Status PENDING  Incomplete  Anaerobic  culture w Gram Stain     Status: None (Preliminary result)   Collection Time: 07/09/20 10:55 AM   Specimen: Bronchoalveolar Lavage  Result Value Ref Range Status   Specimen Description BRONCHIAL ALVEOLAR LAVAGE  Final   Special Requests RLL SPEC A  Final   Gram Stain   Final    NO WBC SEEN NO ORGANISMS SEEN Performed at [facility], [place] [address] [place],  [address]    Culture PENDING  Incomplete   Report Status PENDING  Incomplete  Anaerobic culture w Gram Stain  Status: None (Preliminary result)   Collection Time: 07/09/20 10:58 AM   Specimen: Bronchoalveolar Lavage  Result Value Ref Range Status   Specimen Description BRONCHIAL ALVEOLAR LAVAGE  Final   Special Requests LLL SPEC C  Final   Gram Stain   Final    RARE WBC PRESENT, PREDOMINANTLY MONONUCLEAR NO ORGANISMS SEEN Performed at [facility], [address], [place], [place] [address]    Culture PENDING  Incomplete   Report Status PENDING  Incomplete  Anaerobic culture w Gram Stain     Status: None (Preliminary result)   Collection Time: 07/09/20 10:59 AM   Specimen: PATH Cytology brushing; Body Fluid  Result Value Ref Range Status   Specimen Description FLUID  Final   Special Requests LLL BRUSHING SPEC D  Final   Gram Stain   Final    NO WBC SEEN NO ORGANISMS SEEN Performed at [facility], [address] [place], [place] [address]    Culture PENDING  Incomplete   Report Status PENDING  Incomplete  Culture, BAL-quantitative w Gram Stain     Status: None (Preliminary result)   Collection Time: 07/09/20 10:59 AM   Specimen: Bronchial Brush  Result Value Ref Range Status   Specimen Description BRONCHIAL BRUSHING  Final   Special Requests LLL BRUSHING SPEC D  Final   Gram Stain NO WBC SEEN NO ORGANISMS SEEN   Final   Culture   Final    NO GROWTH < 24 HOURS Performed at [facility], [place] [address], [place], [place] [address]    Report Status PENDING  Incomplete  Culture,  fungus without smear     Status: None (Preliminary result)   Collection Time: 07/09/20  1:04 PM   Specimen: PATH Cytology brushing; Body Fluid  Result Value Ref Range Status   Specimen Description FLUID  Final   Special Requests RLL BRUSHING SPEC B  Final   Culture   Final    NO FUNGUS ISOLATED AFTER 1 DAY Performed at [facility], [address], [place], [place] [address]    Report Status PENDING  Incomplete  Anaerobic culture w Gram Stain     Status: None (Preliminary result)   Collection Time: 07/09/20  1:04 PM   Specimen: PATH Cytology brushing; Body Fluid  Result Value Ref Range Status   Specimen Description FLUID  Final   Special Requests RLL BRUSHING SPEC B  Final   Gram Stain   Final    NO WBC SEEN NO ORGANISMS SEEN Performed at [facility], [address], [place], [place] [address]    Culture PENDING  Incomplete   Report Status PENDING  Incomplete  Culture, BAL-quantitative w Gram Stain     Status: None (Preliminary result)   Collection Time: 07/09/20  1:04 PM   Specimen: Bronchial Brush  Result Value Ref Range Status   Specimen Description BRONCHIAL BRUSHING  Final   Special Requests RLL SPEC B  Final   Gram Stain NO WBC SEEN NO ORGANISMS SEEN   Final   Culture   Final    NO GROWTH < 24 HOURS Performed at [facility], [place] [address], [place], [place] [address]    Report Status PENDING  Incomplete  Culture, Respiratory w Gram Stain     Status: None (Preliminary result)   Collection Time: 07/09/20  1:31 PM   Specimen: PATH Cytology washing; Body Fluid  Result Value Ref Range Status   Specimen  Description BRONCHIAL ALVEOLAR LAVAGE  Final   Special Requests LLL SPEC C  Final   Gram Stain   Final    RARE WBC PRESENT, PREDOMINANTLY MONONUCLEAR NO ORGANISMS SEEN    Culture   Final    NO GROWTH < 24 HOURS Performed at [facility] [address], [place], [place] [address]    Report Status PENDING  Incomplete         Radiology  Studies: DG Chest 1 View  Result Date: 07/09/2020 CLINICAL DATA:  Respiratory distress syndrome EXAM: CHEST  1 VIEW COMPARISON:  07/07/2020 FINDINGS: Stable cardiomediastinal contours. Continued progression of bilateral airspace opacities, most confluent within the lung bases. Small bilateral pleural effusions. No pneumothorax. IMPRESSION: Continued progression of bilateral airspace opacities, most confluent within the lung bases. Electronically Signed   By: Davina Poke D.O.   On: 07/09/2020 12:48        Scheduled Meds: . (feeding supplement) PROSource Plus  30 mL Oral BID BM  . acidophilus  1 capsule Oral Daily  . acyclovir  400 mg Oral Daily  . amLODipine  2.5 mg Oral Daily  . benzonatate  200 mg Oral BID  . buPROPion  100 mg Oral Daily  . Chlorhexidine Gluconate Cloth  6 each Topical Q0600  . darbepoetin (ARANESP) injection - DIALYSIS  60 mcg Intravenous Q Mon-HD  . escitalopram  20 mg Oral Daily  . feeding supplement (NEPRO CARB STEADY)  237 mL Oral BID BM  . guaiFENesin  1,200 mg Oral BID  . heparin  5,000 Units Subcutaneous Q12H  . ipratropium-albuterol  3 mL Nebulization QID  . mouth rinse  15 mL Mouth Rinse BID  . multivitamin  1 tablet Oral QHS  . predniSONE  5 mg Oral Q breakfast  . senna-docusate  1 tablet Oral Daily  . sevelamer carbonate  800 mg Oral TID WC  . simvastatin  10 mg Oral q1800  . Sirolimus  2 mg Oral Daily   Continuous Infusions: . ceFEPime (MAXIPIME) IV Stopped (07/10/20 2158)     LOS: 4 days    Time spent: 30 minutes    Barb Merino, MD [facility] Pager [phone]

## 2020-07-11 NOTE — Progress Notes (Signed)
Pt requested to move 1800 scheduled Renvela and Zocor to 2000 so that she could take them on a full stomach after eating dinner later. Pharmacist approved these meds to be moved to 2000 per patient request.

## 2020-07-11 NOTE — Progress Notes (Incomplete)
Pharmacy Rounding Learning Note - not an active part of the chart  PMH: renal transplant 2005 (failed), CMV infection, HTN, HLD, depression/anxiety   CC/HPI: Pt was recently treated for sepsis secondary to pneumonia at [facility] ER for 10 days. Pt then brought to ED from Dr. Gabriel Carina with complaints of SOB, especially with exertion.    Problem 1: Pneumonia with acute hypoxic respiratory failure - pt O2 sat was 75% on room air upon admission  - chest xray revealed multifocal pneumonia with progression of infiltrate in the right upper lobe and mid lung regions - Pt currently on immunosuppressive therapy of prednisone and sirolimus for kidney transplant - Covid test negative - WBC WNL, afebrile  - pt currently on cefepime 1 g IV q24h - pt has allergies noted to vancomycin (itching) and azithromycin - pt currently on benzonatate, guaifenesin, and Duoneb - pt currently on nasal cannula oxygen 11L/min, O2 sat > 89%    Problem 2: HTN  - BP elevated 3/15 - Pt currently on amlodipine 2.5 mg   Problem 3: Depression/anxiety - pt currently taking bupropion, escitalopram, gabapentin PRN, and has order in for lorazepam 2 mg IV PRN    Problem 4: HLD - Pt currently on simvastatin 10 mg    Renal:  Cause of AKI: failed kidney transplant   SCr: N/A  Dialysis:   Type: hemodialysis    Schedule: MWF   Access type: AV fistula left forearm  Days:  3/11: 400 mL/min, 4 hours 3/14: 400 mL/min, 3 hours  Tolerating?  Urine output:   None noted past few days  Electrolytes: 3/12  Sodium: 138   Potassium: 4.0  Calcium: 9.1, CC 9.3  Magnesium:   Phosphorus: 5.0 (high, 3/11)  Hgb: 8.6 (low, 3/12) Ferritin: 630 (3/11) Iron saturation: 17% (3/11)  Renal meds:  - Aranesp 60 mcg [place] every Mon with HD - Sevelamer carbonate (Renvela) 800 mg TID with meals - prednisone and sirolimus for transplant     Plan:  - continue antibiotics for pneumonia, monitor for improvement with O2 status,  WBC WNL - do not give IV iron until infection resolves  - continue HD on MWF schedule - continue to monitor electrolytes and hemoglobin - switch acyclovir to bedtime instead of AM

## 2020-07-12 DIAGNOSIS — J189 Pneumonia, unspecified organism: Secondary | ICD-10-CM | POA: Diagnosis not present

## 2020-07-12 LAB — ACID FAST SMEAR (AFB, MYCOBACTERIA)
Acid Fast Smear: NEGATIVE
Acid Fast Smear: NEGATIVE
Acid Fast Smear: NEGATIVE
Acid Fast Smear: NEGATIVE

## 2020-07-12 LAB — PLATELET COUNT: Platelets: 468 10*3/uL — ABNORMAL HIGH (ref 150–400)

## 2020-07-12 LAB — CULTURE, BLOOD (ROUTINE X 2): Culture: NO GROWTH

## 2020-07-12 MED ORDER — DARBEPOETIN ALFA 60 MCG/0.3ML IJ SOSY: 60.0000 ug | PREFILLED_SYRINGE | INTRAMUSCULAR | Status: DC

## 2020-07-12 MED ORDER — LOSARTAN POTASSIUM 50 MG PO TABS
50.0000 mg | ORAL_TABLET | Freq: Every day | ORAL | Status: DC
  Administered 2020-07-12 – 2020-07-16 (×5): 50 mg via ORAL
  Filled 2020-07-12 (×5): qty 1

## 2020-07-12 MED ORDER — ACYCLOVIR 400 MG PO TABS
400.0000 mg | ORAL_TABLET | Freq: Every day | ORAL | Status: DC
  Administered 2020-07-13 – 2020-07-15 (×3): 400 mg via ORAL
  Filled 2020-07-12 (×4): qty 1

## 2020-07-12 MED ORDER — FLUTICASONE PROPIONATE 50 MCG/ACT NA SUSP
1.0000 | Freq: Every day | NASAL | Status: DC
  Administered 2020-07-12 – 2020-07-16 (×5): 1 via NASAL
  Filled 2020-07-12 (×2): qty 16

## 2020-07-12 NOTE — Progress Notes (Addendum)
Patient ID: Kaitlyn Schwartz, female   DOB: 06-05-1985, 35 y.o.   MRN: [id number] [facility] Progress Note   Assessment/ Plan:   1.  Altered focal pneumonia with acute hypoxic respiratory failure: On broad-spectrum antimicrobial therapy with cefepime after previously being on Vanc.  Remains on supplemental oxygenation and noted decreased bouts of coughing. 2. ESRD: With history of failed renal transplant and now back on renal replacement therapy with hemodialysis.  Continue this on a Monday/Wednesday/Friday schedule with dialysis ordered again for tomorrow (with 3L UF goal) via left forearm AV fistula.  She remains on sirolimus and low-dose prednisone. 3. Anemia: Without overt blood loss, defer intravenous iron (ferritin 630 with iron saturation 17%) until infection resolved and monitor on ESA. 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 5. Nutrition: Continue renal diet with fluid restriction, on renal multivitamin. 6. Hypertension: Blood pressure within acceptable range, continue to monitor with hemodialysis/UF.  Subjective:   Tolerated dialysis well without problems and agreeable with challenging EDW.   Objective:   BP (!) 149/96   Pulse (!) 110   Temp 98.7 F (37.1 C) (Oral)   Resp (!) 25   Ht 5\' 2"  (1.575 m)   Wt 75 kg   LMP 06/29/2020   SpO2 100%   BMI 30.24 kg/m   Physical Exam: Gen: Comfortably resting in bed, father at bedsie CVS: Pulse regular tachycardia, S1 and S2 normal Resp: Coarse breath sounds bilaterally without distinct rales or rhonchi Abd: Soft, obese, nontender Ext: Trace lower extremity edema, left forearm RCF with poor thrill/suboptimal augmentation  Labs: BMET Recent Labs  Lab 07/07/20 1411 07/08/20 0205 07/09/20 0055  NA 139 135 138  K 3.6 3.6 4.0  CL 99 97* 99  CO2 27 26 29   GLUCOSE 89 100* 84  BUN 12 17 5*  CREATININE 7.83* 9.31* 4.47*  CALCIUM  8.6* 8.0* 9.1  PHOS  --  5.0*  --    CBC Recent Labs  Lab 07/07/20 1411 07/08/20 0205 07/09/20 0055  WBC 8.4 7.2 8.4  NEUTROABS 6.0  --   --   HGB 10.0* 8.3* 8.6*  HCT 31.0* 25.7* 26.7*  MCV 90.1 91.8 89.9  PLT 513* 410* 424*     Medications:    . (feeding supplement) PROSource Plus  30 mL Oral BID BM  . acidophilus  1 capsule Oral Daily  . acyclovir  400 mg Oral Daily  . amLODipine  2.5 mg Oral Daily  . benzonatate  200 mg Oral BID  . buPROPion  100 mg Oral Daily  . Chlorhexidine Gluconate Cloth  6 each Topical Q0600  . [START ON 07/18/2020] darbepoetin (ARANESP) injection - DIALYSIS  60 mcg Intravenous Q Mon-HD  . escitalopram  20 mg Oral Daily  . feeding supplement (NEPRO CARB STEADY)  237 mL Oral BID BM  . fluticasone  1 spray Each Nare Daily  . guaiFENesin  1,200 mg Oral BID  . heparin  5,000 Units Subcutaneous Q12H  . ipratropium-albuterol  3 mL Nebulization QID  . losartan  50 mg Oral Daily  . mouth rinse  15 mL Mouth Rinse BID  . multivitamin  1 tablet Oral QHS  . predniSONE  5 mg Oral Q breakfast  . senna-docusate  1 tablet Oral Daily  . sevelamer carbonate  800 mg Oral TID WC  . simvastatin  10 mg Oral q1800  . Sirolimus  2 mg Oral Daily   Ulice Dash  Posey Pronto, MD 07/12/2020, 10:39 AM

## 2020-07-12 NOTE — Progress Notes (Signed)
Patient transferred via wheelchair on 8L/m HFNC to [address] 29. VSS

## 2020-07-12 NOTE — Progress Notes (Signed)
NAME:  Kaitlyn Schwartz, MRN:  [id number], DOB:  [birth year], LOS: 5 ADMISSION DATE:  07/07/2020, CONSULTATION DATE:  07/08/2020 REFERRING MD:  Dr. 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  Recent admit for same at [facility] and outpatient treatment for ongoing pneumonia.  Admitted by [facility].  Pulmonary consulted for further evaluation.   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    Recently dmitted at [facility] 2/25- 2/28 and treated for CAP/ multifocal pneumonia, right greater than left.  Neg RVP panel.  Noted to be treated inpatient with ceftriaxone and azithro however patient unable to tolerate azithro secondary to nausea and was discontinued.  Discharged home on Augmentin.  Completed course however but then developed SOB again with nausea, vomiting, and diarrhea.  Evaluated in ER on 3/6 with CT abdomen concerning for possibly colitis (felt likely secondary to her recent abx use) and CXR still showing patchy bilateral airspace opacities.  Negative for COVID/ flu.  She was sent home on 10 course of doxycycline with plans to f/u with PCP.  However she was unable to tolerate doxycycline course secondary to nausea and took two day course.   Of note, has remained on iHD for the last 5 years given partial rejection and remained on immunosuppressant medications including tacrolimus and prednisone at the recommendations of her nephrologist.  She remains on the renal transplant list.  Presented back to the ER on 3/10, sent from her PCP office where she has  ongoing exertional dyspnea, fever of 101, and found to be hypoxic on room air at 75%.  She was unable to have a full iHD session on 3/9.   Reports some nasal congestion and rhinorrhea, productive cough with dark brown sputum, denies hemoptysis.   In ER, patient afebrile, hemodynamically stable and requiring 4L [place].  Labs noted for normal WBC, Hgb 10-> 8.3, sCr 7.83, K 3.6, normal lactic acid, neg troponin, neg COVID/ flu, CXR showing progression of right upper lobe infiltrate and unchanged extensive infiltrates in the lower lobes.  CTA PE was negative for PE but did show multifocal pneumonia.  She was admitted to [facility] and started on vancomycin and cefepime coverage.  Pulmonary consulted for further recommendations.   Symptoms began 3 weeks ago, dry cough, DOE, subjective fevers, malaise.  Worsening SOB over past 2 weeks.  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  Micro Data:  3/10 SARS/ flu >> neg 3/10 BCx >> neg 3/10 MRSA >> neg 3/12 BAL  bacterial >>  3/12 BAL AFB >>  3/12 BAL fungal >>  3/12 TBBx tissue cx's >    Antimicrobials:  3/10 cefepime >> 3/10 vanc >>3/13  Interim History / Subjective:   Continues to improve daily.  Precedex is off  Objective   Blood pressure 140/90,  pulse (!) 107, temperature 98.7 F (37.1 C), temperature source Oral, resp. rate 17, height _0  (1.575 m), weight 75 kg, last menstrual period 06/29/2020, SpO2 96 %.    FiO2 (%):  [40 %] 40 %   Intake/Output Summary (Last 24 hours) at 07/12/2020 0949 Last data filed at 07/12/2020 0020 Gross per 24 hour  Intake 150 ml  Output 2000 ml  Net -1850 ml   Filed Weights   07/07/20 1354 07/09/20 1003 07/09/20 1300  Weight: 73.9 kg 73.9 kg 75 kg   Physical Exam: General: Obese female no acute distress at rest reports breathing better HEENT: No JVD lymphadenopathy is appreciated Neuro: Without focal defect follows commands awake alert CV: Regular regular rate and rhythm PULM: Diminished throughout currently on 11 L/min O2 with O2 saturations of 96   GI: soft, bsx4 active  Extremities: warm/dry, 1+ edema  Skin: no rashes or lesions   Resolved Hospital Problem list    Assessment & Plan:   Multifocal PNA in immunosuppressed patient: opportunistic vs. HCAP vs. Inflammatory vs. Drug-associated; severely elevated Pct argues for bacterial BAL with lymphocytic predominance. Culture neg in RLL and LLL so far Acute on chronic hypoxic respiratory failure - worsened post-op.  Suspect infectious/inflammatory, consider also volume status and pulmonary edema  Wean FiO2 as able. Aggressive pulmonary toilet On empiric cefepime with no positive culture data at this time Continue to follow off steroids No P JP is available at this time      ESRD on MWF iHD S/p kidney transplant with partial rejection Continue current immunosuppressants Hemodialysis per nephrology Negative I&O as able   Hx CMV infection Continue acyclovir  Best practice (evaluated daily)  Diet: Regular diet Pain/Anxiety/Delirium protocol (if indicated): n/a VAP protocol (if indicated): per primary DVT prophylaxis: : per primary GI prophylaxis: : per primary Glucose control: : per primary Mobility: : per  primary Disposition:: per primary   [place] Nurse Practitioner [place] Please consult Amion 07/12/2020, 9:49 AM

## 2020-07-12 NOTE — Progress Notes (Signed)
PROGRESS NOTE    Kaitlyn Schwartz  ZOX:[id number] DOB: January 17, 1986 DOA: 07/07/2020 PCP: Kaitlyn Stalker, PA-C    Brief Narrative:  Patient is a 35 year old female with history of atrophic kidney status post renal transplant in 2005, CMV infection, currently on chronic prednisone therapy, failed transplant on hemodialysis who was recently treated at [facility] emergency room with 10 days of antibiotics presented back to the emergency room with ongoing cough and shortness of breath.  In the emergency room, she was 75% on room air.  CT chest showed multifocal pneumonia.  Patient was admitted to hospital with pulmonary consultation.  She underwent bronchoscopy and bronchoalveolar lavage.  Remains in the hospital with high oxygen requirement.   Assessment & Plan:   Active Problems:   Multifocal pneumonia  Multifocal pneumonia in immunocompromised , acute hypoxemic respiratory failure:  Patient currently remains on vancomycin and cefepime.   Chest physiotherapy, incentive spirometry, deep breathing exercises, sputum induction, mucolytic's and bronchodilators. All cultures are negative so far. Supplemental oxygen to keep saturations more than 90%. Patient is seen by PCCM and underwent bronchoscopy on 3/12, no positive growth so far. Still remains on significant nasal cannula oxygen, currently on 10-11 L/min however clinically improving since bronchoscopy.  Continue mobilizing.  ESRD on hemodialysis: Monday Wednesday Friday dialysis.  Followed by nephrology.  Renal transplant: Patient on tacrolimus and low-dose prednisone that is continued.  Hypertension: Blood pressure stable on amlodipine and losartan.  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  Continue.   DVT prophylaxis: heparin injection 5,000 Units Start: 07/07/20 2200   Code Status: Full code Family Communication: None today. Disposition Plan: Status is: Inpatient  Remains inpatient appropriate because:IV treatments appropriate due  to intensity of illness or inability to take PO and Inpatient level of care appropriate due to severity of illness   Dispo: The patient is from: Home              Anticipated d/c is to: Home              Patient currently is not medically stable to d/c.   Difficult to place patient No         Consultants:   PCCM  Nephrology  Procedures:   None  Antimicrobials:   Vancomycin, cefepime 3/10---   Subjective:  Patient seen and examined.  No overnight events.  She thinks she is breathing better.  Plenty of mucoid sputum.  Afebrile.  Remains on high flow oxygen, however feels more comfortable than usual.  Complains of a stuffy nose. Objective: Vitals:   07/12/20 1030 07/12/20 1100 07/12/20 1200 07/12/20 1217  BP:   (!) 131/98 (!) 131/98  Pulse: (!) 110  (!) 111 (!) 128  Resp: (!) 25  (!) 28 (!) 28  Temp:  98.8 F (37.1 C)    TempSrc:  Oral    SpO2: 100%  91% 92%  Weight:      Height:        Intake/Output Summary (Last 24 hours) at 07/12/2020 1317 Last data filed at 07/12/2020 1000 Gross per 24 hour  Intake 200 ml  Output 2000 ml  Net -1800 ml   Filed Weights   07/07/20 1354 07/09/20 1003 07/09/20 1300  Weight: 73.9 kg 73.9 kg 75 kg    Examination:  General: Looks comfortable.  She is on 10 L of oxygen.  Not obvious distress. Cardiovascular: S1-S2 normal.  Tachycardic.  Regular. Respiratory: Bilateral equal air entry.  Equal.  No additional sounds. Gastrointestinal: Soft.  Nontender.  Bowel  sounds present. Ext: Normal.  Left hand with AV fistula. Neuro: Alert oriented x4. Musculoskeletal: Equal in all extremities.   Data Reviewed: I have personally reviewed following labs and imaging studies  CBC: Recent Labs  Lab 07/07/20 1411 07/08/20 0205 07/09/20 0055 07/12/20 1112  WBC 8.4 7.2 8.4  --   NEUTROABS 6.0  --   --   --   HGB 10.0* 8.3* 8.6*  --   HCT 31.0* 25.7* 26.7*  --   MCV 90.1 91.8 89.9  --   PLT 513* 410* 424* 093*   Basic Metabolic  Panel: Recent Labs  Lab 07/07/20 1411 07/08/20 0205 07/09/20 0055  NA 139 135 138  K 3.6 3.6 4.0  CL 99 97* 99  CO2 _0 GLUCOSE 89 100* 84  BUN 12 17 5*  CREATININE 7.83* 9.31* 4.47*  CALCIUM 8.6* 8.0* 9.1  PHOS  --  5.0*  --    GFR: Estimated Creatinine Clearance: 16.7 mL/min (A) (by C-G formula based on SCr of 4.47 mg/dL (H)). Liver Function Tests: No results for input(s): AST, ALT, ALKPHOS, BILITOT, PROT, ALBUMIN in the last 168 hours. No results for input(s): LIPASE, AMYLASE in the last 168 hours. No results for input(s): AMMONIA in the last 168 hours. Coagulation Profile: No results for input(s): INR, PROTIME in the last 168 hours. Cardiac Enzymes: No results for input(s): CKTOTAL, CKMB, CKMBINDEX, TROPONINI in the last 168 hours. BNP (last 3 results) No results for input(s): PROBNP in the last 8760 hours. HbA1C: Recent Labs    07/09/20 2346  HGBA1C 5.8*   CBG: Recent Labs  Lab 07/10/20 0806 07/10/20 1133 07/10/20 1703 07/10/20 2134 07/11/20 0724  GLUCAP 135* 164* 135* 151* 124*   Lipid Profile: No results for input(s): CHOL, HDL, LDLCALC, TRIG, CHOLHDL, LDLDIRECT in the last 72 hours. Thyroid Function Tests: No results for input(s): TSH, T4TOTAL, FREET4, T3FREE, THYROIDAB in the last 72 hours. Anemia Panel: No results for input(s): VITAMINB12, FOLATE, FERRITIN, TIBC, IRON, RETICCTPCT in the last 72 hours. Sepsis Labs: Recent Labs  Lab 07/07/20 1459 07/08/20 0205  PROCALCITON  --  86.33  LATICACIDVEN 0.7  --     Recent Results (from the past 240 hour(s))  Blood culture (routine x 2)     Status: None   Collection Time: 07/07/20  2:17 PM   Specimen: BLOOD  Result Value Ref Range Status   Specimen Description BLOOD RIGHT ANTECUBITAL  Final   Special Requests   Final    BOTTLES DRAWN AEROBIC AND ANAEROBIC Blood Culture results may not be optimal due to an excessive volume of blood received in culture bottles   Culture   Final    NO GROWTH 5  DAYS Performed at [facility] [address] [place], [place] [address]    Report Status 07/12/2020 FINAL  Final  Resp Panel by RT-PCR (Flu A&B, Covid) Nasopharyngeal Swab     Status: None   Collection Time: 07/07/20  2:34 PM   Specimen: Nasopharyngeal Swab; Nasopharyngeal(NP) swabs in vial transport medium  Result Value Ref Range Status   SARS Coronavirus 2 by RT PCR NEGATIVE NEGATIVE Final    Comment: (NOTE) SARS-CoV-2 target nucleic acids are NOT DETECTED.  The SARS-CoV-2 RNA is generally detectable in upper respiratory specimens during the acute phase of infection. The lowest concentration of SARS-CoV-2 viral copies this assay can detect is 138 copies/mL. A negative result does not preclude SARS-Cov-2 infection and should not be used as the sole  basis for treatment or other patient management decisions. A negative result may occur with  improper specimen collection/handling, submission of specimen other than nasopharyngeal swab, presence of viral mutation(s) within the areas targeted by this assay, and inadequate number of viral copies(<138 copies/mL). A negative result must be combined with clinical observations, patient history, and epidemiological information. The expected result is Negative.  Fact Sheet for Patients:  EntrepreneurPulse.com.au  Fact Sheet for Healthcare Providers:  IncredibleEmployment.be  This test is no t yet approved or cleared by the [place] FDA and  has been authorized for detection and/or diagnosis of SARS-CoV-2 by FDA under an Emergency Use Authorization (EUA). This EUA will remain  in effect (meaning this test can be used) for the duration of the COVID-19 declaration under Section 564(b)(1) of the Act, 21 U.S.C.section 360bbb-3(b)(1), unless the authorization is terminated  or revoked sooner.       Influenza A by PCR NEGATIVE NEGATIVE Final   Influenza B by PCR NEGATIVE NEGATIVE Final     Comment: (NOTE) The Xpert Xpress SARS-CoV-2/FLU/RSV plus assay is intended as an aid in the diagnosis of influenza from Nasopharyngeal swab specimens and should not be used as a sole basis for treatment. Nasal washings and aspirates are unacceptable for Xpert Xpress SARS-CoV-2/FLU/RSV testing.  Fact Sheet for Patients: EntrepreneurPulse.com.au  Fact Sheet for Healthcare Providers: IncredibleEmployment.be  This test is not yet approved or cleared by the [place] FDA and has been authorized for detection and/or diagnosis of SARS-CoV-2 by FDA under an Emergency Use Authorization (EUA). This EUA will remain in effect (meaning this test can be used) for the duration of the COVID-19 declaration under Section 564(b)(1) of the Act, 21 U.S.C. section 360bbb-3(b)(1), unless the authorization is terminated or revoked.  Performed at [facility], [place] [address], [place], [place] [address]   MRSA PCR Screening     Status: None   Collection Time: 07/07/20 10:12 PM   Specimen: Nasal Mucosa; Nasopharyngeal  Result Value Ref Range Status   MRSA by PCR NEGATIVE NEGATIVE Final    Comment:        The GeneXpert MRSA Assay (FDA approved for NASAL specimens only), is one component of a comprehensive MRSA colonization surveillance program. It is not intended to diagnose MRSA infection nor to guide or monitor treatment for MRSA infections. Performed at [facility], [place] [address], [place], [place] [address]   Culture, Respiratory w Gram Stain     Status: None   Collection Time: 07/09/20 10:55 AM   Specimen: PATH Cytology washing; Body Fluid  Result Value Ref Range Status   Specimen Description BRONCHIAL ALVEOLAR LAVAGE  Final   Special Requests RLL BAL SPEC A  Final   Gram Stain NO WBC SEEN NO ORGANISMS SEEN   Final   Culture   Final    NO GROWTH Performed at [facility], [address] [place], [place] [address]    Report  Status 07/11/2020 FINAL  Final  Acid Fast Smear (AFB)     Status: None   Collection Time: 07/09/20 10:55 AM   Specimen: PATH Cytology washing; Body Fluid  Result Value Ref Range Status   AFB Specimen Processing Concentration  Final   Acid Fast Smear Negative  Final    Comment: (NOTE) Performed At: [facility] [place], [place] [id number] Rush Farmer MD [id number]    Source (AFB) BRONCHIAL ALVEOLAR LAVAGE  Final    Comment: RLL BAL SPEC A Performed  at [facility], [place] [address] [place], [place] [address]   Anaerobic culture w Gram Stain     Status: None (Preliminary result)   Collection Time: 07/09/20 10:55 AM   Specimen: Bronchoalveolar Lavage  Result Value Ref Range Status   Specimen Description BRONCHIAL ALVEOLAR LAVAGE  Final   Special Requests RLL SPEC A  Final   Gram Stain   Final    NO WBC SEEN NO ORGANISMS SEEN Performed at [facility], [place] [address], [place], [place] [address]    Culture   Final    NO ANAEROBES ISOLATED; CULTURE IN PROGRESS FOR 5 DAYS   Report Status PENDING  Incomplete  Anaerobic culture w Gram Stain     Status: None (Preliminary result)   Collection Time: 07/09/20 10:58 AM   Specimen: Bronchoalveolar Lavage  Result Value Ref Range Status   Specimen Description BRONCHIAL ALVEOLAR LAVAGE  Final   Special Requests LLL SPEC C  Final   Gram Stain   Final    RARE WBC PRESENT, PREDOMINANTLY MONONUCLEAR NO ORGANISMS SEEN Performed at [facility], [place] [address], [place], [place] [address]    Culture   Final    NO ANAEROBES ISOLATED; CULTURE IN PROGRESS FOR 5 DAYS   Report Status PENDING  Incomplete  Acid Fast Smear (AFB)     Status: None   Collection Time: 07/09/20 10:59 AM   Specimen: PATH Cytology brushing; Body Fluid  Result Value Ref Range Status   AFB Specimen Processing Concentration  Final   Acid Fast Smear Negative  Final    Comment: (NOTE) Performed At: [facility] [place], [place] [id number] Rush Farmer MD [id number]:[id number]    Source (AFB) LLL BRUSHING [facility] D  Final    Comment: Performed at [facility], [place] [address], [place], [place] [address]  Anaerobic culture w Gram Stain     Status: None (Preliminary result)   Collection Time: 07/09/20 10:59 AM   Specimen: PATH Cytology brushing; Body Fluid  Result Value Ref Range Status   Specimen Description FLUID  Final   Special Requests LLL BRUSHING SPEC D  Final   Gram Stain   Final    NO WBC SEEN NO ORGANISMS SEEN Performed at [facility], [address], [place], [place] [address]    Culture   Final    NO ANAEROBES ISOLATED; CULTURE IN PROGRESS FOR 5 DAYS   Report Status PENDING  Incomplete  Culture, BAL-quantitative w Gram Stain     Status: None   Collection Time: 07/09/20 10:59 AM   Specimen: Bronchial Brush  Result Value Ref Range Status   Specimen Description BRONCHIAL BRUSHING  Final   Special Requests LLL BRUSHING SPEC D  Final   Gram Stain NO WBC SEEN NO ORGANISMS SEEN   Final   Culture   Final    NO GROWTH Performed at [facility], [place] [address] [place], [place] [address]    Report Status 07/11/2020 FINAL  Final  Culture, fungus without smear     Status: None (Preliminary result)   Collection Time: 07/09/20  1:04 PM   Specimen: PATH Cytology brushing; Body Fluid  Result Value Ref Range Status   Specimen Description FLUID  Final   Special Requests RLL BRUSHING SPEC B  Final   Culture   Final    NO FUNGUS ISOLATED AFTER 3 DAYS Performed at [facility], [address],  [place], [place] [address]    Report Status PENDING  Incomplete  Acid Fast Smear (AFB)     Status: None   Collection Time: 07/09/20  1:04 PM   Specimen: PATH Cytology brushing; Body Fluid  Result Value Ref Range Status   AFB Specimen Processing Concentration  Final   Acid Fast Smear Negative  Final    Comment: (NOTE) Performed At: [facility] [place], [place] [id number] Rush Farmer MD PJ:[id number]    Source (AFB) FLUID  Final    Comment: RLL BRUSHING SPEC B Performed at [facility], [place] [address] [place], [place] [address]   Anaerobic culture w Gram Stain     Status: None (Preliminary result)   Collection Time: 07/09/20  1:04 PM   Specimen: PATH Cytology brushing; Body Fluid  Result Value Ref Range Status   Specimen Description FLUID  Final   Special Requests RLL BRUSHING SPEC B  Final   Gram Stain   Final    NO WBC SEEN NO ORGANISMS SEEN Performed at [facility], [address] [place], [place] [address]    Culture   Final    NO ANAEROBES ISOLATED; CULTURE IN PROGRESS FOR 5 DAYS   Report Status PENDING  Incomplete  Culture, BAL-quantitative w Gram Stain     Status: None   Collection Time: 07/09/20  1:04 PM   Specimen: Bronchial Brush  Result Value Ref Range Status   Specimen Description BRONCHIAL BRUSHING  Final   Special Requests RLL SPEC B  Final   Gram Stain NO WBC SEEN NO ORGANISMS SEEN   Final   Culture   Final    NO GROWTH Performed at [facility], [place] [address], [place], [place] [address]    Report Status 07/11/2020 FINAL  Final  Culture, Respiratory w Gram Stain     Status: None   Collection Time: 07/09/20  1:31 PM   Specimen: PATH Cytology washing; Body Fluid  Result Value Ref Range Status   Specimen Description BRONCHIAL ALVEOLAR LAVAGE  Final   Special Requests LLL SPEC C  Final   Gram Stain   Final    RARE WBC PRESENT, PREDOMINANTLY MONONUCLEAR NO ORGANISMS SEEN    Culture   Final    NO GROWTH Performed at [facility], [address] [place], [place] [address]    Report Status 07/11/2020 FINAL  Final  Acid Fast Smear (AFB)     Status: None   Collection Time: 07/09/20  1:31 PM   Specimen: PATH Cytology washing; Body Fluid  Result Value Ref Range Status   AFB Specimen Processing Concentration  Final   Acid Fast Smear Negative  Final    Comment:  (NOTE) Performed At: [facility] [place], [place] [id number] Rush Farmer MD QI:[id number]    Source (AFB) BRONCHIAL ALVEOLAR LAVAGE  Final    Comment: LLL SPEC C Performed at [facility], [place] [address] [place], [place] [address]          Radiology Studies: No results found.      Scheduled Meds: . (feeding supplement) PROSource Plus  30 mL Oral BID BM  . acidophilus  1 capsule Oral Daily  . acyclovir  400 mg Oral Daily  . amLODipine  2.5 mg Oral Daily  . benzonatate  200 mg Oral BID  . buPROPion  100 mg Oral Daily  . Chlorhexidine Gluconate Cloth  6 each Topical Q0600  . [  START ON 07/18/2020] darbepoetin (ARANESP) injection - DIALYSIS  60 mcg Intravenous Q Mon-HD  . escitalopram  20 mg Oral Daily  . feeding supplement (NEPRO CARB STEADY)  237 mL Oral BID BM  . fluticasone  1 spray Each Nare Daily  . guaiFENesin  1,200 mg Oral BID  . heparin  5,000 Units Subcutaneous Q12H  . ipratropium-albuterol  3 mL Nebulization QID  . losartan  50 mg Oral Daily  . mouth rinse  15 mL Mouth Rinse BID  . multivitamin  1 tablet Oral QHS  . predniSONE  5 mg Oral Q breakfast  . senna-docusate  1 tablet Oral Daily  . sevelamer carbonate  800 mg Oral TID WC  . simvastatin  10 mg Oral q1800  . Sirolimus  2 mg Oral Daily   Continuous Infusions: . ceFEPime (MAXIPIME) IV Stopped (07/11/20 2303)     LOS: 5 days    Time spent: 30 minutes    Barb Merino, MD [facility] Pager [phone]

## 2020-07-12 NOTE — Progress Notes (Signed)
NAME:  Macon Lesesne, MRN:  [id number], DOB:  [birth year], LOS: 5 ADMISSION DATE:  07/07/2020, CONSULTATION DATE:  07/08/2020 REFERRING MD:  Dr. 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  Recent admit for same at [facility] and outpatient treatment for ongoing pneumonia.  Admitted by [facility].  Pulmonary consulted for further evaluation.   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    Recently dmitted at [facility] 2/25- 2/28 and treated for CAP/ multifocal pneumonia, right greater than left.  Neg RVP panel.  Noted to be treated inpatient with ceftriaxone and azithro however patient unable to tolerate azithro secondary to nausea and was discontinued.  Discharged home on Augmentin.  Completed course however but then developed SOB again with nausea, vomiting, and diarrhea.  Evaluated in ER on 3/6 with CT abdomen concerning for possibly colitis (felt likely secondary to her recent abx use) and CXR still showing patchy bilateral airspace opacities.  Negative for COVID/ flu.  She was sent home on 10 course of doxycycline with plans to f/u with PCP.  However she was unable to tolerate doxycycline course secondary to nausea and took two day course.   Of note, has remained on iHD for the last 5 years given partial rejection and remained on immunosuppressant medications including tacrolimus and prednisone at the recommendations of her nephrologist.  She remains on the renal transplant list.  Presented back to the ER on 3/10, sent from her PCP office where she has  ongoing exertional dyspnea, fever of 101, and found to be hypoxic on room air at 75%.  She was unable to have a full iHD session on 3/9.   Reports some nasal congestion and rhinorrhea, productive cough with dark brown sputum, denies hemoptysis.   In ER, patient afebrile, hemodynamically stable and requiring 4L [place].  Labs noted for normal WBC, Hgb 10-> 8.3, sCr 7.83, K 3.6, normal lactic acid, neg troponin, neg COVID/ flu, CXR showing progression of right upper lobe infiltrate and unchanged extensive infiltrates in the lower lobes.  CTA PE was negative for PE but did show multifocal pneumonia.  She was admitted to [facility] and started on vancomycin and cefepime coverage.  Pulmonary consulted for further recommendations.   Symptoms began 3 weeks ago, dry cough, DOE, subjective fevers, malaise.  Worsening SOB over past 2 weeks.  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  Micro Data:  3/10 SARS/ flu >> neg 3/10 BCx >> neg 3/10 MRSA >> neg 3/12 BAL  bacterial >> neg 3/12 BAL AFB >>  3/12 BAL fungal >>  3/12 TBBx tissue cx's > neg 3/12 BAL PJP >>   Antimicrobials:  3/10 cefepime >> 3/10 vanc >>3/13  Interim History / Subjective:   All of her smears are negative.  The bacterial cultures are  negative, AFB and fungal cultures will take weeks to be final.  PJP was able to be ordered and is pending Remains on 11 L/min I/O- 621 cc total   Objective   Blood pressure 140/90, pulse (!) 107, temperature 98.7 F (37.1 C), temperature source Oral, resp. rate 17, height _0  (1.575 m), weight 75 kg, last menstrual period 06/29/2020, SpO2 96 %.    FiO2 (%):  [40 %] 40 %   Intake/Output Summary (Last 24 hours) at 07/12/2020 0946 Last data filed at 07/12/2020 0020 Gross per 24 hour  Intake 150 ml  Output 2000 ml  Net -1850 ml   Filed Weights   07/07/20 1354 07/09/20 1003 07/09/20 1300  Weight: 73.9 kg 73.9 kg 75 kg   Physical Exam: General: More comfortable today, more comfortable respiratory pattern HENT: Oropharynx clear Eyes: No icterus Respiratory: Scattered bilateral inspiratory crackles, no wheeze Cardiovascular: Regular, distant, no murmur Extremities: No edema Neuro: Awake, alert, appropriate, nonfocal Skin: No rash  Resolved Hospital Problem list    Assessment & Plan:   Multifocal PNA in immunosuppressed patient: opportunistic vs. HCAP vs. Inflammatory vs. Drug-associated; severely elevated Pct argues for bacterial BAL with lymphocytic predominance. Culture neg in RLL and LLL so far Acute on chronic hypoxic respiratory failure - worsened post-op.  Suspect infectious/inflammatory, consider also volume status and pulmonary edema -Continue to wean O2, clinically improved and looks like we can decrease today 3/15 -Agree okay to transition out of the intensive care unit -Remains on empiric cefepime, question duration of therapy especially with negative respiratory cultures -Following off steroids -Follow AFB, fungal, PJP studies   ESRD on MWF iHD S/p kidney transplant with partial rejection -Continuing her antirejection medication -Intermittent HD as per nephrology plans.  Would benefit from aggressive volume removal as she can tolerate  Hx CMV infection -Daily  acyclovir   Baltazar Apo, MD, PhD 07/12/2020, 9:46 AM [facility] [phone] or if no answer before 7:00PM call [phone] For any issues after 7:00PM please call eLink [phone]

## 2020-07-13 ENCOUNTER — Inpatient Hospital Stay (HOSPITAL_COMMUNITY): Payer: Managed Care, Other (non HMO)

## 2020-07-13 LAB — RENAL FUNCTION PANEL
Albumin: 2.5 g/dL — ABNORMAL LOW (ref 3.5–5.0)
Anion gap: 13 (ref 5–15)
BUN: 35 mg/dL — ABNORMAL HIGH (ref 6–20)
CO2: 24 mmol/L (ref 22–32)
Calcium: 8.2 mg/dL — ABNORMAL LOW (ref 8.9–10.3)
Chloride: 100 mmol/L (ref 98–111)
Creatinine, Ser: 8.96 mg/dL — ABNORMAL HIGH (ref 0.44–1.00)
GFR, Estimated: 5 mL/min — ABNORMAL LOW (ref >60)
Glucose, Bld: 93 mg/dL (ref 70–99)
Phosphorus: 4 mg/dL (ref 2.5–4.6)
Potassium: 3.7 mmol/L (ref 3.5–5.1)
Sodium: 137 mmol/L (ref 135–145)

## 2020-07-13 LAB — FUNGITELL, SERUM: Fungitell Result: 33 pg/mL (ref <80)

## 2020-07-13 LAB — CBC
HCT: 25.1 % — ABNORMAL LOW (ref 36.0–46.0)
Hemoglobin: 8.3 g/dL — ABNORMAL LOW (ref 12.0–15.0)
MCH: 29.7 pg (ref 26.0–34.0)
MCHC: 33.1 g/dL (ref 30.0–36.0)
MCV: 90 fL (ref 80.0–100.0)
Platelets: 453 10*3/uL — ABNORMAL HIGH (ref 150–400)
RBC: 2.79 MIL/uL — ABNORMAL LOW (ref 3.87–5.11)
RDW: 12.1 % (ref 11.5–15.5)
WBC: 10.1 10*3/uL (ref 4.0–10.5)
nRBC: 0 % (ref 0.0–0.2)

## 2020-07-13 LAB — PHOSPHORUS: Phosphorus: 3.6 mg/dL (ref 2.5–4.6)

## 2020-07-13 LAB — MAGNESIUM: Magnesium: 2.2 mg/dL (ref 1.7–2.4)

## 2020-07-13 MED ORDER — MELATONIN 5 MG PO TABS
5.0000 mg | ORAL_TABLET | Freq: Every day | ORAL | Status: DC
  Administered 2020-07-13 – 2020-07-15 (×3): 5 mg via ORAL
  Filled 2020-07-13 (×3): qty 1

## 2020-07-13 MED ORDER — HEPARIN SODIUM (PORCINE) 1000 UNIT/ML DIALYSIS: 40.0000 [IU]/kg | INTRAMUSCULAR | Status: DC | PRN

## 2020-07-13 NOTE — Progress Notes (Signed)
PCCM Progress Note  Chart reviewed. Improved oxygen requirement to 7L. No new data from bronchoscopy. No changes to therapy at this time.  Rodman Pickle, M.D. [facility] Medicine 07/13/2020 1:52 PM

## 2020-07-13 NOTE — Progress Notes (Signed)
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  In the emergency room, she was 75% on room air.  CT chest showed multifocal pneumonia.  Patient was admitted to hospital with pulmonary consultation.  She underwent bronchoscopy and bronchoalveolar lavage.  Remains in the hospital with high oxygen requirement.   Assessment & Plan:   Active Problems:   Multifocal pneumonia  Multifocal pneumonia in immunocompromised , acute hypoxemic respiratory failure:  Patient was treated with vancomycin and cefepime.  Currently remains on cefepime.  Chest physiotherapy, incentive spirometry, deep breathing exercises, sputum induction, mucolytic's and bronchodilators. All cultures are negative so far. Supplemental oxygen to keep saturations more than 90%.  Still on significant oxygen. Patient is seen by PCCM and underwent bronchoscopy on 3/12, no positive growth so far. Still remains on significant oxygen.  ESRD on hemodialysis: Monday Wednesday Friday dialysis.  Followed by nephrology.  Renal transplant: Patient on tacrolimus and low-dose prednisone that is continued.  Hypertension: Blood pressure stable on amlodipine and losartan.  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  Continue.   DVT prophylaxis: heparin injection 5,000 Units Start: 07/07/20 2200   Code Status: Full code Family Communication: None today. Disposition Plan: Status is: Inpatient  Remains inpatient appropriate because:IV treatments appropriate due to intensity of illness or inability to take PO and  Inpatient level of care appropriate due to severity of illness   Dispo: The patient is from: Home              Anticipated d/c is to: Home              Patient currently is not medically stable to d/c.   Difficult to place patient No         Consultants:   PCCM  Nephrology  Procedures:   None  Antimicrobials:   Vancomycin, stopped.  cefepime 3/10---   Subjective:  Patient seen and examined.  Still has cough, breathing is somehow improved.  No other complaints.  Receiving dialysis.  Remains afebrile.  Currently on 7 L oxygen and saturating well.  Objective: Vitals:   07/13/20 0930 07/13/20 1000 07/13/20 1005 07/13/20 1030  BP: 127/85 (!) 134/95 (!) 134/95 137/89  Pulse:   95   Resp:   (!) 28   Temp:      TempSrc:      SpO2:      Weight:      Height:        Intake/Output Summary (Last 24 hours) at 07/13/2020 1103 Last data filed at 07/12/2020 1900 Gross per 24 hour  Intake 300 ml  Output 202 ml  Net 98 ml   Filed Weights   07/09/20 1300 07/12/20 1949 07/13/20 0745  Weight: 75 kg 75 kg 76.8 kg    Examination: General: Looks comfortable.  Receiving dialysis.  Currently on 7 L oxygen. Cardiovascular: S1-S2 normal.  No added sounds. Respiratory: Bilateral clear.  No added sound. Gastrointestinal: Soft and nontender.  Bowel sounds present. Ext: No cyanosis or edema. Neuro: No deficits. Musculoskeletal: Left forearm AV fistula, receiving dialysis.     Data Reviewed: I have personally reviewed  following labs and imaging studies  CBC: Recent Labs  Lab 07/07/20 1411 07/08/20 0205 07/09/20 0055 07/12/20 1112 07/13/20 0810  WBC 8.4 7.2 8.4  --  10.1  NEUTROABS 6.0  --   --   --   --   HGB 10.0* 8.3* 8.6*  --  8.3*  HCT 31.0* 25.7* 26.7*  --  25.1*  MCV 90.1 91.8 89.9  --  90.0  PLT 513* 410* 424* 468* 017*   Basic Metabolic Panel: Recent Labs  Lab 07/07/20 1411 07/08/20 0205 07/09/20 0055 07/13/20 0236 07/13/20 0810  NA 139 135 138  --   137  K 3.6 3.6 4.0  --  3.7  CL 99 97* 99  --  100  CO2 _0 --  24  GLUCOSE 89 100* 84  --  93  BUN 12 17 5*  --  35*  CREATININE 7.83* 9.31* 4.47*  --  8.96*  CALCIUM 8.6* 8.0* 9.1  --  8.2*  MG  --   --   --  2.2  --   PHOS  --  5.0*  --  3.6 4.0   GFR: Estimated Creatinine Clearance: 8.4 mL/min (A) (by C-G formula based on SCr of 8.96 mg/dL (H)). Liver Function Tests: Recent Labs  Lab 07/13/20 0810  ALBUMIN 2.5*   No results for input(s): LIPASE, AMYLASE in the last 168 hours. No results for input(s): AMMONIA in the last 168 hours. Coagulation Profile: No results for input(s): INR, PROTIME in the last 168 hours. Cardiac Enzymes: No results for input(s): CKTOTAL, CKMB, CKMBINDEX, TROPONINI in the last 168 hours. BNP (last 3 results) No results for input(s): PROBNP in the last 8760 hours. HbA1C: No results for input(s): HGBA1C in the last 72 hours. CBG: Recent Labs  Lab 07/10/20 0806 07/10/20 1133 07/10/20 1703 07/10/20 2134 07/11/20 0724  GLUCAP 135* 164* 135* 151* 124*   Lipid Profile: No results for input(s): CHOL, HDL, LDLCALC, TRIG, CHOLHDL, LDLDIRECT in the last 72 hours. Thyroid Function Tests: No results for input(s): TSH, T4TOTAL, FREET4, T3FREE, THYROIDAB in the last 72 hours. Anemia Panel: No results for input(s): VITAMINB12, FOLATE, FERRITIN, TIBC, IRON, RETICCTPCT in the last 72 hours. Sepsis Labs: Recent Labs  Lab 07/07/20 1459 07/08/20 0205  PROCALCITON  --  86.33  LATICACIDVEN 0.7  --     Recent Results (from the past 240 hour(s))  Blood culture (routine x 2)     Status: None   Collection Time: 07/07/20  2:17 PM   Specimen: BLOOD  Result Value Ref Range Status   Specimen Description BLOOD RIGHT ANTECUBITAL  Final   Special Requests   Final    BOTTLES DRAWN AEROBIC AND ANAEROBIC Blood Culture results may not be optimal due to an excessive volume of blood received in culture bottles   Culture   Final    NO GROWTH 5 DAYS Performed  at [facility] [address], [place], [place] [address]    Report Status 07/12/2020 FINAL  Final  Resp Panel by RT-PCR (Flu A&B, Covid) Nasopharyngeal Swab     Status: None   Collection Time: 07/07/20  2:34 PM   Specimen: Nasopharyngeal Swab; Nasopharyngeal(NP) swabs in vial transport medium  Result Value Ref Range Status   SARS Coronavirus 2 by RT PCR NEGATIVE NEGATIVE Final    Comment: (NOTE) SARS-CoV-2 target nucleic acids are NOT DETECTED.  The SARS-CoV-2 RNA is generally detectable in upper respiratory specimens during the acute phase of infection.  The lowest concentration of SARS-CoV-2 viral copies this assay can detect is 138 copies/mL. A negative result does not preclude SARS-Cov-2 infection and should not be used as the sole basis for treatment or other patient management decisions. A negative result may occur with  improper specimen collection/handling, submission of specimen other than nasopharyngeal swab, presence of viral mutation(s) within the areas targeted by this assay, and inadequate number of viral copies(<138 copies/mL). A negative result must be combined with clinical observations, patient history, and epidemiological information. The expected result is Negative.  Fact Sheet for Patients:  EntrepreneurPulse.com.au  Fact Sheet for Healthcare Providers:  IncredibleEmployment.be  This test is no t yet approved or cleared by the [place] FDA and  has been authorized for detection and/or diagnosis of SARS-CoV-2 by FDA under an Emergency Use Authorization (EUA). This EUA will remain  in effect (meaning this test can be used) for the duration of the COVID-19 declaration under Section 564(b)(1) of the Act, 21 U.S.C.section 360bbb-3(b)(1), unless the authorization is terminated  or revoked sooner.       Influenza A by PCR NEGATIVE NEGATIVE Final   Influenza B by PCR NEGATIVE NEGATIVE Final    Comment: (NOTE) The  Xpert Xpress SARS-CoV-2/FLU/RSV plus assay is intended as an aid in the diagnosis of influenza from Nasopharyngeal swab specimens and should not be used as a sole basis for treatment. Nasal washings and aspirates are unacceptable for Xpert Xpress SARS-CoV-2/FLU/RSV testing.  Fact Sheet for Patients: EntrepreneurPulse.com.au  Fact Sheet for Healthcare Providers: IncredibleEmployment.be  This test is not yet approved or cleared by the [place] FDA and has been authorized for detection and/or diagnosis of SARS-CoV-2 by FDA under an Emergency Use Authorization (EUA). This EUA will remain in effect (meaning this test can be used) for the duration of the COVID-19 declaration under Section 564(b)(1) of the Act, 21 U.S.C. section 360bbb-3(b)(1), unless the authorization is terminated or revoked.  Performed at [facility], [place] [address], [place], [place] [address]   MRSA PCR Screening     Status: None   Collection Time: 07/07/20 10:12 PM   Specimen: Nasal Mucosa; Nasopharyngeal  Result Value Ref Range Status   MRSA by PCR NEGATIVE NEGATIVE Final    Comment:        The GeneXpert MRSA Assay (FDA approved for NASAL specimens only), is one component of a comprehensive MRSA colonization surveillance program. It is not intended to diagnose MRSA infection nor to guide or monitor treatment for MRSA infections. Performed at [facility], [place] [address], [place], [place] [address]   Fungus Culture With Stain     Status: None (Preliminary result)   Collection Time: 07/09/20 10:55 AM   Specimen: PATH Cytology washing; Body Fluid  Result Value Ref Range Status   Fungus Stain Final report  Final    Comment: (NOTE) Performed At: [facility] [place], [place] [id number] Rush Farmer MD AS:[id number]    Fungus (Mycology) Culture PENDING  Incomplete   Fungal Source BRONCHIAL ALVEOLAR LAVAGE  Final    Comment: RLL  BAL SPEC A Performed at [facility], [place] [address], [place], [place] [address]   Culture, Respiratory w Gram Stain     Status: None   Collection Time: 07/09/20 10:55 AM   Specimen: PATH Cytology washing; Body Fluid  Result Value Ref Range Status   Specimen Description BRONCHIAL ALVEOLAR LAVAGE  Final   Special Requests RLL BAL SPEC A  Final  Gram Stain NO WBC SEEN NO ORGANISMS SEEN   Final   Culture   Final    NO GROWTH Performed at [facility], [place] [address], [place], [place] [address]    Report Status 07/11/2020 FINAL  Final  Acid Fast Smear (AFB)     Status: None   Collection Time: 07/09/20 10:55 AM   Specimen: PATH Cytology washing; Body Fluid  Result Value Ref Range Status   AFB Specimen Processing Concentration  Final   Acid Fast Smear Negative  Final    Comment: (NOTE) Performed At: [facility] [place], [place] [id number] Rush Farmer MD UD:[id number]    Source (AFB) BRONCHIAL ALVEOLAR LAVAGE  Final    Comment: RLL BAL SPEC A Performed at [facility], [place] [address] [place], [place] [address]   Anaerobic culture w Gram Stain     Status: None (Preliminary result)   Collection Time: 07/09/20 10:55 AM   Specimen: Bronchoalveolar Lavage  Result Value Ref Range Status   Specimen Description BRONCHIAL ALVEOLAR LAVAGE  Final   Special Requests RLL SPEC A  Final   Gram Stain   Final    NO WBC SEEN NO ORGANISMS SEEN Performed at [facility], [place] [address], [place], [place] [address]    Culture   Final    NO ANAEROBES ISOLATED; CULTURE IN PROGRESS FOR 5 DAYS   Report Status PENDING  Incomplete  Fungus Culture Result     Status: None   Collection Time: 07/09/20 10:55 AM  Result Value Ref Range Status   Result 1 Comment  Final    Comment: (NOTE) KOH/Calcofluor preparation:  no fungus observed. Performed At: [facility] [place], [place] [id number] Rush Farmer MD [id number]    Anaerobic culture w Gram Stain     Status: None (Preliminary result)   Collection Time: 07/09/20 10:58 AM   Specimen: Bronchoalveolar Lavage  Result Value Ref Range Status   Specimen Description BRONCHIAL ALVEOLAR LAVAGE  Final   Special Requests LLL SPEC C  Final   Gram Stain   Final    RARE WBC PRESENT, PREDOMINANTLY MONONUCLEAR NO ORGANISMS SEEN Performed at [facility], [place] [address] [place], [place] [address]    Culture   Final    NO ANAEROBES ISOLATED; CULTURE IN PROGRESS FOR 5 DAYS   Report Status PENDING  Incomplete  Acid Fast Smear (AFB)     Status: None   Collection Time: 07/09/20 10:59 AM   Specimen: PATH Cytology brushing; Body Fluid  Result Value Ref Range Status   AFB Specimen Processing Concentration  Final   Acid Fast Smear Negative  Final    Comment: (NOTE) Performed At: [facility] [place], [place] [id number] Rush Farmer MD [id number]    Source (AFB) LLL BRUSHING [facility] D  Final    Comment: Performed at [facility], [place] [address] [place], [place] [address]  Anaerobic culture w Gram Stain     Status: None (Preliminary result)   Collection Time: 07/09/20 10:59 AM   Specimen: PATH Cytology brushing; Body Fluid  Result Value Ref Range Status   Specimen Description FLUID  Final   Special Requests LLL BRUSHING SPEC D  Final   Gram Stain   Final    NO WBC SEEN NO ORGANISMS SEEN Performed at [facility], [address] [place], [place] [address]    Culture   Final  NO ANAEROBES ISOLATED; CULTURE IN PROGRESS FOR 5 DAYS   Report Status PENDING  Incomplete  Culture, BAL-quantitative w Gram Stain     Status: None   Collection Time: 07/09/20 10:59 AM   Specimen: Bronchial Brush  Result Value Ref Range Status   Specimen Description BRONCHIAL BRUSHING  Final   Special Requests LLL BRUSHING SPEC D  Final   Gram Stain NO WBC SEEN NO ORGANISMS SEEN   Final   Culture   Final    NO GROWTH Performed at [facility], [place] [address] [place], [place] [address]    Report Status 07/11/2020 FINAL  Final  Culture, fungus without smear     Status: None (Preliminary result)   Collection Time: 07/09/20  1:04 PM   Specimen: PATH Cytology brushing; Body Fluid  Result Value Ref Range Status   Specimen Description FLUID  Final   Special Requests RLL BRUSHING SPEC B  Final   Culture   Final    NO FUNGUS ISOLATED AFTER 3 DAYS Performed at [facility], [address], [place], [place] [address]    Report Status PENDING  Incomplete  Acid Fast Smear (AFB)     Status: None   Collection Time: 07/09/20  1:04 PM   Specimen: PATH Cytology brushing; Body Fluid  Result Value Ref Range Status   AFB Specimen Processing Concentration  Final   Acid Fast Smear Negative  Final    Comment: (NOTE) Performed At: [facility] [place], [place] [id number] Rush Farmer MD [id number]    Source (AFB) FLUID  Final    Comment: RLL BRUSHING SPEC B Performed at [facility], [place] [address] [place] [address], [place], [place] [address]   Anaerobic culture w Gram Stain     Status: None (Preliminary result)   Collection Time: 07/09/20  1:04 PM   Specimen: PATH Cytology brushing; Body Fluid  Result Value Ref Range Status   Specimen Description FLUID  Final   Special Requests RLL BRUSHING SPEC B  Final   Gram Stain   Final    NO WBC SEEN NO ORGANISMS SEEN Performed at [facility], [address] [place], [place] [address]    Culture   Final    NO ANAEROBES ISOLATED; CULTURE IN PROGRESS FOR 5 DAYS   Report Status PENDING  Incomplete  Culture, BAL-quantitative w Gram Stain     Status: None   Collection Time: 07/09/20  1:04 PM   Specimen: Bronchial Brush  Result Value Ref Range Status   Specimen Description BRONCHIAL BRUSHING  Final   Special Requests RLL SPEC B  Final   Gram Stain NO WBC SEEN NO ORGANISMS SEEN   Final   Culture   Final    NO GROWTH Performed at [place], [place] [address] [place], [place] [address]    Report Status 07/11/2020 FINAL  Final  Fungus Culture With Stain     Status: None (Preliminary result)   Collection Time: 07/09/20  1:31 PM   Specimen: PATH Cytology washing; Body Fluid  Result Value Ref Range Status   Fungus Stain Final report  Final    Comment: (NOTE) Performed At: [facility] [place], [place] [id number] Rush Farmer MD [id number]    Fungus (Mycology) Culture PENDING  Incomplete   Fungal Source BRONCHIAL ALVEOLAR LAVAGE  Final    Comment: LLL SPEC C Performed at [facility], [place],  [place] [address]   Culture, Respiratory w Gram Stain     Status: None   Collection Time: 07/09/20  1:31 PM   Specimen: PATH Cytology washing; Body Fluid  Result Value Ref Range Status   Specimen Description BRONCHIAL ALVEOLAR LAVAGE  Final   Special Requests LLL SPEC C  Final   Gram Stain   Final    RARE WBC PRESENT, PREDOMINANTLY MONONUCLEAR NO ORGANISMS SEEN    Culture   Final    NO GROWTH Performed at [facility], [address], [place], [place] [address]    Report Status 07/11/2020 FINAL  Final  Acid Fast Smear (AFB)     Status: None   Collection Time: 07/09/20  1:31 PM   Specimen: PATH Cytology washing; Body Fluid  Result Value Ref Range Status   AFB Specimen Processing Concentration  Final   Acid Fast Smear Negative  Final    Comment: (NOTE) Performed At: [facility] [place], [place] [id number] Rush Farmer MD [id number]    Source (AFB) BRONCHIAL ALVEOLAR LAVAGE  Final    Comment: LLL SPEC C Performed at [facility], [place] [address], [place], [place] [address]   Fungus Culture Result     Status: None   Collection Time: 07/09/20  1:31 PM  Result Value Ref Range Status   Result 1 Comment  Final    Comment: (NOTE) KOH/Calcofluor preparation:  no fungus observed. Performed At: [facility] [place], [place] [id number] Rush Farmer MD VO:[id number]          Radiology Studies: DG Chest Port 1 View  Result Date: 07/13/2020 CLINICAL DATA:  Abnormal respiration with shortness of breath EXAM: PORTABLE CHEST 1 VIEW COMPARISON:  Four days ago FINDINGS: Extensive bilateral airspace disease with left upper lobe progression. Lung volumes remain low. Normal heart size. No visible effusion or pneumothorax IMPRESSION: Bilateral airspace disease that is progressed from 4 days ago. Electronically Signed   By: Monte Fantasia M.D.   On: 07/13/2020 08:08        Scheduled Meds: . (feeding supplement) PROSource Plus  30 mL Oral BID BM  . acidophilus  1 capsule Oral Daily  . acyclovir  400 mg Oral Daily  . amLODipine  2.5 mg Oral Daily  . benzonatate  200 mg Oral BID  . buPROPion  100 mg Oral Daily  . Chlorhexidine Gluconate Cloth  6 each Topical Q0600  . [START ON 07/18/2020] darbepoetin (ARANESP) injection - DIALYSIS  60 mcg Intravenous Q Mon-HD  . escitalopram  20 mg Oral Daily  . feeding supplement (NEPRO CARB STEADY)  237 mL Oral BID BM  . fluticasone  1 spray Each Nare Daily  . guaiFENesin  1,200 mg Oral BID  . heparin  5,000 Units Subcutaneous Q12H  . ipratropium-albuterol  3 mL Nebulization QID  . losartan  50 mg Oral Daily  . mouth rinse  15 mL Mouth Rinse BID  . multivitamin  1 tablet Oral QHS  . predniSONE  5 mg Oral Q breakfast  . senna-docusate  1 tablet Oral Daily  . sevelamer carbonate  800 mg Oral TID WC  . simvastatin  10 mg Oral q1800  . Sirolimus  2 mg Oral Daily   Continuous Infusions: . ceFEPime (MAXIPIME) IV 1 g (07/12/20 2101)     LOS: 6 days    Time spent: 25 minutes    Barb Merino, MD [facility] Pager [phone]

## 2020-07-13 NOTE — Procedures (Signed)
Patient seen on Hemodialysis. BP 140/87   Pulse 98   Temp 99 F (37.2 C) (Oral)   Resp (!) 22   Ht 5\' 2"  (1.575 m)   Wt 76.8 kg   LMP 06/29/2020   SpO2 94%   BMI 30.97 kg/m   QB 400, UF goal 3L Tolerating treatment without complaints at this time.  Elmarie Shiley MD [facility]. Office # [phone] Pager # [phone] 8:43 AM

## 2020-07-13 NOTE — Progress Notes (Signed)
Pharmacy Antibiotic Note  Kaitlyn Schwartz is a 35 y.o. female admitted on 07/07/2020 with pneumonia s./p bronch. Patient has history of ESRD on HD MWF and kidney transplant. Pharmacy has been consulted for cefepime dosing.   The patient was initiated on Cefepime on 3/10 - today is D#7 of treatment. Cultures have been negative and LOT should be evaluated. ESRD-MWF, on schedule and receiving today. No adjustments to dose needed.   Plan: - Continue Cefepime 1g IV every 24 hours - Will continue to follow HD schedule/duration, culture results, LOT, and antibiotic de-escalation plans    Height: 5\' 2"  (157.5 cm) Weight: 76.8 kg (169 lb 5 oz) IBW/kg (Calculated) : 50.1  Temp (24hrs), Avg:98.8 F (37.1 C), Min:98.5 F (36.9 C), Max:99 F (37.2 C)  Recent Labs  Lab 07/07/20 1411 07/07/20 1459 07/08/20 0205 07/09/20 0055 07/13/20 0810  WBC 8.4  --  7.2 8.4 10.1  CREATININE 7.83*  --  9.31* 4.47* 8.96*  LATICACIDVEN  --  0.7  --   --   --     Estimated Creatinine Clearance: 8.4 mL/min (A) (by C-G formula based on SCr of 8.96 mg/dL (H)).    Allergies  Allergen Reactions  . Lisinopril Shortness Of Breath    Other reaction(s): Cough   . Vancomycin Itching  . Versed [Midazolam] Anxiety  . Azithromycin     IV only  . Cellcept [Mycophenolate] Diarrhea  . Dobutamine     Other reaction(s): HTN, bradycardia  . Ibuprofen     Other reaction(s): *Unknown Pt was told not to take ibuprofen after kidney transplant - Sep 10, 2003    Antimicrobials this admission: Vancomycin 3/10 > 3/13 Cefepime 3/10 >>  Dose adjustments this admission: n/a  Microbiology results: 3/10 Bcx: ngtd 3/13 bronch: no organisms on gram stain; pending MRSA PCR negative   Thank you for allowing pharmacy to be a part of this patient's care.  Alycia Rossetti, PharmD, BCPS Clinical Pharmacist Clinical phone for 07/13/2020: [phone] 07/13/2020 9:25 AM   **Pharmacist phone directory can now be found on [email or web address]  (PW [email or web address]).  Listed under [place].

## 2020-07-14 DIAGNOSIS — J189 Pneumonia, unspecified organism: Secondary | ICD-10-CM | POA: Diagnosis not present

## 2020-07-14 LAB — ANAEROBIC CULTURE W GRAM STAIN
Gram Stain: NONE SEEN
Gram Stain: NONE SEEN
Gram Stain: NONE SEEN

## 2020-07-14 MED ORDER — BOOST / RESOURCE BREEZE PO LIQD CUSTOM
1.0000 | Freq: Three times a day (TID) | ORAL | Status: DC
  Administered 2020-07-14 – 2020-07-15 (×3): 1 via ORAL

## 2020-07-14 MED ORDER — SODIUM CHLORIDE 3 % IN NEBU
4.0000 mL | INHALATION_SOLUTION | Freq: Once | RESPIRATORY_TRACT | Status: DC | PRN
  Filled 2020-07-14: qty 4

## 2020-07-14 NOTE — Progress Notes (Addendum)
Nutrition Follow-up  DOCUMENTATION CODES:   Not applicable  INTERVENTION:   D/c Nepro shake  D/c Prosource Plus  Mighty Shake po TID, each supplement provides 330 kcal and 9 grams of protein  Boost Breeze po TID, each supplement provides 250 kcal and 9 grams of protein  Continue Renavite daily  NUTRITION DIAGNOSIS:   Increased nutrient needs related to chronic illness (ESRD on HD) as evidenced by estimated needs. -- ongoing  GOAL:   Patient will meet greater than or equal to 90% of their needs -- not met   MONITOR:   PO intake,Supplement acceptance,Labs,Weight trends,I & O's  REASON FOR ASSESSMENT:   Malnutrition Screening Tool    ASSESSMENT:   Pt admitted with acute hypoxic respiratory failure 2/2 multifocal PNA. PMH includes ESRD on HD s/p kidney transplant 2005 partial rejection, on immunosuppressant 2/2 atrophic native kidneys, s/p CMV infections, HTN, anxiety/depression, HLD.   3/12 - s/p Flexible bronchoscopy with brushing (12751) and Flexible bronchoscopy with bronchial alveolar lavage; cultures negative   Pt's appetite has been poor since last RD assessment. Last 6 meal completions documented as 20-80% (34% average meal intake). Pt with orders for Prosource Plus and Nepro BID. Per RN, pt typically refusing supplements. Will d/c and trial other supplements given pt's continued poor po intake.   Admit wt: 73.9 kg Current wt: 73.5 kg EDW unknown Last HD 3/16, 3L net UF  No UOP documented x 24 hours   Medications: acidophilus, aranesp, deltasone, rena-vit, senokot-s, renvela,  Labs reviewed.   Diet Order:   Diet Order            Diet regular Room service appropriate? Yes; Fluid consistency: Thin  Diet effective now                 EDUCATION NEEDS:   No education needs have been identified at this time  Skin:  Skin Assessment: Reviewed RN Assessment  Last BM:  3/16  Height:   Ht Readings from Last 1 Encounters:  07/12/20 _0  (1.575 m)     Weight:   Wt Readings from Last 1 Encounters:  07/13/20 73.5 kg   BMI:  Body mass index is 29.64 kg/m.  Estimated Nutritional Needs:   Kcal:  2000-2200  Protein:  100-110g  Fluid:  1L+UOP    Larkin Ina, MS, RD, LDN RD pager number and weekend/on-call pager number located in [place].

## 2020-07-14 NOTE — Progress Notes (Signed)
Patient ID: Kaitlyn Schwartz, female   DOB: 29-Mar-1986, 35 y.o.   MRN: [id number] [facility] Progress Note   Assessment/ Plan:   1.  Altered focal pneumonia with acute hypoxic respiratory failure: On broad-spectrum antimicrobial therapy with cefepime after previously being on Vanc.  Decreasing oxygen requirements noted via nasal cannula at this time.  Cultures so far negative to date. 2. ESRD: With history of failed renal transplant and now back on renal replacement therapy with hemodialysis.  Continue this on a Monday/Wednesday/Friday schedule with dialysis ordered again for tomorrow via left forearm AV fistula.  She remains on sirolimus and low-dose prednisone. 3. Anemia: Without overt blood loss, defer intravenous iron (ferritin 630 with iron saturation 17%) until infection resolved and monitor on ESA. 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 5. Nutrition: Continue renal diet with fluid restriction, on renal multivitamin. 6. Hypertension: Blood pressure within acceptable range, continue to monitor with hemodialysis/UF.  Subjective:   Tolerated hemodialysis yesterday without problems.  Still having intermittent bouts of cough.   Objective:   BP (!) 132/91 (BP Location: Right Arm)   Pulse 96   Temp 98.6 F (37 C) (Oral)   Resp 18   Ht 5\' 2"  (1.575 m)   Wt 73.5 kg   LMP 06/29/2020   SpO2 93%   BMI 29.64 kg/m   Physical Exam: Gen: Comfortably resting in bed CVS: Pulse regular tachycardia, S1 and S2 normal Resp: Coarse breath sounds bilaterally without distinct rales or rhonchi Abd: Soft, obese, nontender Ext: Trace lower extremity edema, left forearm RCF with poor thrill/suboptimal augmentation  Labs: BMET Recent Labs  Lab 07/07/20 1411 07/08/20 0205 07/09/20 0055 07/13/20 0236 07/13/20 0810  NA 139 135 138  --  137  K 3.6 3.6 4.0  --  3.7  CL 99 97* 99  --  100  CO2 27 26  29   --  24  GLUCOSE 89 100* 84  --  93  BUN 12 17 5*  --  35*  CREATININE 7.83* 9.31* 4.47*  --  8.96*  CALCIUM 8.6* 8.0* 9.1  --  8.2*  PHOS  --  5.0*  --  3.6 4.0   CBC Recent Labs  Lab 07/07/20 1411 07/08/20 0205 07/09/20 0055 07/12/20 1112 07/13/20 0810  WBC 8.4 7.2 8.4  --  10.1  NEUTROABS 6.0  --   --   --   --   HGB 10.0* 8.3* 8.6*  --  8.3*  HCT 31.0* 25.7* 26.7*  --  25.1*  MCV 90.1 91.8 89.9  --  90.0  PLT 513* 410* 424* 468* 453*     Medications:    . (feeding supplement) PROSource Plus  30 mL Oral BID BM  . acidophilus  1 capsule Oral Daily  . acyclovir  400 mg Oral Daily  . amLODipine  2.5 mg Oral Daily  . benzonatate  200 mg Oral BID  . buPROPion  100 mg Oral Daily  . Chlorhexidine Gluconate Cloth  6 each Topical Q0600  . [START ON 07/18/2020] darbepoetin (ARANESP) injection - DIALYSIS  60 mcg Intravenous Q Mon-HD  . escitalopram  20 mg Oral Daily  . feeding supplement (NEPRO CARB STEADY)  237 mL Oral BID BM  . fluticasone  1 spray Each Nare Daily  . guaiFENesin  1,200 mg Oral BID  . heparin  5,000 Units Subcutaneous Q12H  . ipratropium-albuterol  3 mL Nebulization QID  .  losartan  50 mg Oral Daily  . mouth rinse  15 mL Mouth Rinse BID  . melatonin  5 mg Oral QHS  . multivitamin  1 tablet Oral QHS  . predniSONE  5 mg Oral Q breakfast  . senna-docusate  1 tablet Oral Daily  . sevelamer carbonate  800 mg Oral TID WC  . simvastatin  10 mg Oral q1800  . Sirolimus  2 mg Oral Daily   Elmarie Shiley, MD 07/14/2020, 7:50 AM

## 2020-07-14 NOTE — Progress Notes (Signed)
NAME:  Kaitlyn Schwartz, MRN:  [id number], DOB:  January 22, 1986, LOS: 7 ADMISSION DATE:  07/07/2020, CONSULTATION DATE:  07/08/2020 REFERRING MD:  Dr. Darrick Meigs, CHIEF COMPLAINT:  Fever, SOB  Brief History:  35 yoF  immunosuppressed patient s/p partially failed renal transplant on ESRD, presenting with fever, hypoxia and ongoing multifocal PNA on chest CT.  Recent admit for same at [facility] and outpatient treatment for ongoing pneumonia.  Admitted by [facility].  Pulmonary consulted for further evaluation.   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    Recently dmitted at [facility] 2/25- 2/28 and treated for CAP/ multifocal pneumonia, right greater than left.  Neg RVP panel.  Noted to be treated inpatient with ceftriaxone and azithro however patient unable to tolerate azithro secondary to nausea and was discontinued.  Discharged home on Augmentin.  Completed course however but then developed SOB again with nausea, vomiting, and diarrhea.  Evaluated in ER on 3/6 with CT abdomen concerning for possibly colitis (felt likely secondary to her recent abx use) and CXR still showing patchy bilateral airspace opacities.  Negative for COVID/ flu.  She was sent home on 10 course of doxycycline with plans to f/u with PCP.  However she was unable to tolerate doxycycline course secondary to nausea and took two day course.   Of note, has remained on iHD for the last 5 years given partial rejection and remained on immunosuppressant medications including tacrolimus and prednisone at the recommendations of her nephrologist.  She remains on the renal transplant list.  Presented back to the ER on 3/10, sent from her PCP office where she has  ongoing exertional dyspnea, fever of 101, and found to be hypoxic on room air at 75%.  She was unable to have a full iHD session on 3/9.   Reports some nasal congestion and rhinorrhea, productive cough with dark brown sputum, denies hemoptysis.   In ER, patient afebrile, hemodynamically stable and requiring 4L Thermal.  Labs noted for normal WBC, Hgb 10-> 8.3, sCr 7.83, K 3.6, normal lactic acid, neg troponin, neg COVID/ flu, CXR showing progression of right upper lobe infiltrate and unchanged extensive infiltrates in the lower lobes.  CTA PE was negative for PE but did show multifocal pneumonia.  She was admitted to [facility] and started on vancomycin and cefepime coverage.  Pulmonary consulted for further recommendations.   Symptoms began 3 weeks ago, dry cough, DOE, subjective fevers, malaise.  Worsening SOB over past 2 weeks.  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  Micro Data:  3/10 SARS/ flu >> neg 3/10 BCx >> neg 3/10 MRSA >> neg 3/12 BAL  bacterial >>  3/12 BAL AFB >>  3/12 BAL fungal >>  3/12 TBBx tissue cx's >  07/14/2020 sputum culture PCP  Antimicrobials:  3/10 cefepime >> 3/10 vanc >>3/13  Interim History / Subjective:  Continues to improve on current therapy  Objective    Blood pressure (!) 154/96, pulse (!) 124, temperature 98.3 F (36.8 C), temperature source Oral, resp. rate 16, height _0  (1.575 m), weight 73.5 kg, last menstrual period 06/29/2020, SpO2 92 %.        Intake/Output Summary (Last 24 hours) at 07/14/2020 0949 Last data filed at 07/13/2020 1105 Gross per 24 hour  Intake --  Output 3000 ml  Net -3000 ml   Filed Weights   07/12/20 1949 07/13/20 0745 07/13/20 1105  Weight: 75 kg 76.8 kg 73.5 kg   Physical Exam: General: Obese female no acute distress at rest very upbeat today HEENT: MM pink/moist, no JVD is appreciated Neuro: Grossly intact without focal defect CV: Heart sounds are regular rate and rhythm PULM: Diminished throughout currently on 3 L nasal cannula O2 sats are 94% GI: soft, bsx4 active  Extremities: warm/dry, 1+ edema  Skin: no rashes or lesions    Resolved Hospital Problem list    Assessment & Plan:   Multifocal PNA in immunosuppressed patient: opportunistic vs. HCAP vs. Inflammatory vs. Drug-associated; severely elevated Pct argues for bacterial BAL with lymphocytic predominance. Culture neg in RLL and LLL so far Acute on chronic hypoxic respiratory failure - worsened post-op.  Suspect infectious/inflammatory, consider also volume status and pulmonary edema  Wean oxygen as tolerated Continue pulmonary toilet Currently on cefepime without positive culture data consider consider completing 7 to 10-day course She is now back on her home prednisone dose of 5 mg daily No PCP was available but since she is improving on current treatment modalities, we will order sputum culture for PCP 07/14/2020. Pulmonary critical care will be available as needed.      ESRD on MWF iHD Per nephrology   Hx CMV infection Continue acyclovir  Best practice (evaluated daily)  Diet: Regular diet Pain/Anxiety/Delirium protocol (if indicated): n/a VAP protocol (if indicated): per primary DVT prophylaxis: : per primary GI  prophylaxis: : per primary Glucose control: : per primary Mobility: : per primary Disposition:: per primary   [place] ACNP Acute Care Nurse Practitioner [facility] Please consult Amion 07/14/2020, 9:49 AM

## 2020-07-14 NOTE — Progress Notes (Signed)
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  In the emergency room, she was 75% on room air.  CT chest showed multifocal pneumonia.  Patient was admitted to hospital with pulmonary consultation.  She underwent bronchoscopy and bronchoalveolar lavage.  Remains in the hospital with high oxygen requirement and need for IV antibiotics.   Assessment & Plan:   Active Problems:   Multifocal pneumonia  Multifocal pneumonia in immunocompromised , acute hypoxemic respiratory failure:  Patient was treated with vancomycin and cefepime.  Currently remains on cefepime.  Chest physiotherapy, incentive spirometry, deep breathing exercises, sputum induction, mucolytic's and bronchodilators. All cultures are negative so far. Supplemental oxygen to keep saturations more than 90%.  Oxygenation is improving. Patient is seen by PCCM and underwent bronchoscopy on 3/12, no positive growth so far. Continue to wean off. Will complete 7 days of IV antibiotics if no any culture data available, if continues to improve may discharge home on oral antibiotics for broad-spectrum coverage for total 10 days. Hopefully with more dialysis she continues to come off the oxygen.  ESRD on hemodialysis: Monday Wednesday Friday dialysis.  Followed by nephrology.  Renal transplant: Patient on tacrolimus and low-dose prednisone that is continued.  Hypertension: Blood pressure stable on amlodipine and losartan.  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  Continue.   DVT prophylaxis: heparin  injection 5,000 Units Start: 07/07/20 2200   Code Status: Full code Family Communication: Patient's father at the bedside. Disposition Plan: Status is: Inpatient  Remains inpatient appropriate because:IV treatments appropriate due to intensity of illness or inability to take PO and Inpatient level of care appropriate due to severity of illness   Dispo: The patient is from: Home              Anticipated d/c is to: Home              Patient currently is not medically stable to d/c.   Difficult to place patient No         Consultants:   PCCM  Nephrology  Procedures:   None  Antimicrobials:   Vancomycin, stopped.  cefepime 3/10---   Subjective:  Patient seen and examined.  No overnight events.  Still has some cough but better.  Could walk around on 3 to 4 L of oxygen.  She thinks her oxygen requirement is improving.  Objective: Vitals:   07/14/20 0000 07/14/20 0400 07/14/20 0800 07/14/20 0849  BP: 131/85 (!) 132/91 (!) 154/96   Pulse: 94 96 (!) 124   Resp: _0 Temp: 98.6 F (37 C)  98.3 F (36.8 C)   TempSrc: Oral  Oral   SpO2: 94% 93% 92% 93%  Weight:      Height:        Intake/Output Summary (Last 24 hours) at 07/14/2020 1123 Last data filed at 07/14/2020 0849 Gross per 24 hour  Intake 120 ml  Output -  Net 120 ml   Filed Weights   07/12/20 1949 07/13/20 0745 07/13/20 1105  Weight: 75 kg 76.8 kg 73.5 kg    Examination: General: Looks comfortable.SpO2: 93 %  O2 Flow Rate (L/min): 3 L/min FiO2 (%): 40 %  Cardiovascular: S1-S2 normal.  No added sounds. Respiratory: Bilateral clear.  No added sound. Gastrointestinal: Soft and nontender.  Bowel sounds present. Ext: No cyanosis or edema. Neuro: No deficits. Musculoskeletal: Left forearm AV fistula, receiving dialysis.     Data Reviewed: I have personally reviewed following labs and imaging studies  CBC: Recent Labs  Lab 07/07/20 1411 07/08/20 0205 07/09/20 0055 07/12/20 1112  07/13/20 0810  WBC 8.4 7.2 8.4  --  10.1  NEUTROABS 6.0  --   --   --   --   HGB 10.0* 8.3* 8.6*  --  8.3*  HCT 31.0* 25.7* 26.7*  --  25.1*  MCV 90.1 91.8 89.9  --  90.0  PLT 513* 410* 424* 468* 027*   Basic Metabolic Panel: Recent Labs  Lab 07/07/20 1411 07/08/20 0205 07/09/20 0055 07/13/20 0236 07/13/20 0810  NA 139 135 138  --  137  K 3.6 3.6 4.0  --  3.7  CL 99 97* 99  --  100  CO2 _0 --  24  GLUCOSE 89 100* 84  --  93  BUN 12 17 5*  --  35*  CREATININE 7.83* 9.31* 4.47*  --  8.96*  CALCIUM 8.6* 8.0* 9.1  --  8.2*  MG  --   --   --  2.2  --   PHOS  --  5.0*  --  3.6 4.0   GFR: Estimated Creatinine Clearance: 8.2 mL/min (A) (by C-G formula based on SCr of 8.96 mg/dL (H)). Liver Function Tests: Recent Labs  Lab 07/13/20 0810  ALBUMIN 2.5*   No results for input(s): LIPASE, AMYLASE in the last 168 hours. No results for input(s): AMMONIA in the last 168 hours. Coagulation Profile: No results for input(s): INR, PROTIME in the last 168 hours. Cardiac Enzymes: No results for input(s): CKTOTAL, CKMB, CKMBINDEX, TROPONINI in the last 168 hours. BNP (last 3 results) No results for input(s): PROBNP in the last 8760 hours. HbA1C: No results for input(s): HGBA1C in the last 72 hours. CBG: Recent Labs  Lab 07/10/20 0806 07/10/20 1133 07/10/20 1703 07/10/20 2134 07/11/20 0724  GLUCAP 135* 164* 135* 151* 124*   Lipid Profile: No results for input(s): CHOL, HDL, LDLCALC, TRIG, CHOLHDL, LDLDIRECT in the last 72 hours. Thyroid Function Tests: No results for input(s): TSH, T4TOTAL, FREET4, T3FREE, THYROIDAB in the last 72 hours. Anemia Panel: No results for input(s): VITAMINB12, FOLATE, FERRITIN, TIBC, IRON, RETICCTPCT in the last 72 hours. Sepsis Labs: Recent Labs  Lab 07/07/20 1459 07/08/20 0205  PROCALCITON  --  86.33  LATICACIDVEN 0.7  --     Recent Results (from the past 240 hour(s))  Blood culture (routine x 2)     Status: None   Collection Time:  07/07/20  2:17 PM   Specimen: BLOOD  Result Value Ref Range Status   Specimen Description BLOOD RIGHT ANTECUBITAL  Final   Special Requests   Final    BOTTLES DRAWN AEROBIC AND ANAEROBIC Blood Culture results may not be optimal due to an excessive volume of blood received in culture bottles   Culture   Final    NO GROWTH 5 DAYS Performed at [facility] [address], [place], [place] [address]    Report Status 07/12/2020 FINAL  Final  Resp Panel by RT-PCR (Flu A&B, Covid) Nasopharyngeal Swab     Status: None   Collection Time: 07/07/20  2:34  PM   Specimen: Nasopharyngeal Swab; Nasopharyngeal(NP) swabs in vial transport medium  Result Value Ref Range Status   SARS Coronavirus 2 by RT PCR NEGATIVE NEGATIVE Final    Comment: (NOTE) SARS-CoV-2 target nucleic acids are NOT DETECTED.  The SARS-CoV-2 RNA is generally detectable in upper respiratory specimens during the acute phase of infection. The lowest concentration of SARS-CoV-2 viral copies this assay can detect is 138 copies/mL. A negative result does not preclude SARS-Cov-2 infection and should not be used as the sole basis for treatment or other patient management decisions. A negative result may occur with  improper specimen collection/handling, submission of specimen other than nasopharyngeal swab, presence of viral mutation(s) within the areas targeted by this assay, and inadequate number of viral copies(<138 copies/mL). A negative result must be combined with clinical observations, patient history, and epidemiological information. The expected result is Negative.  Fact Sheet for Patients:  EntrepreneurPulse.com.au  Fact Sheet for Healthcare Providers:  IncredibleEmployment.be  This test is no t yet approved or cleared by the [place] FDA and  has been authorized for detection and/or diagnosis of SARS-CoV-2 by FDA under an Emergency Use Authorization (EUA). This EUA will  remain  in effect (meaning this test can be used) for the duration of the COVID-19 declaration under Section 564(b)(1) of the Act, 21 U.S.C.section 360bbb-3(b)(1), unless the authorization is terminated  or revoked sooner.       Influenza A by PCR NEGATIVE NEGATIVE Final   Influenza B by PCR NEGATIVE NEGATIVE Final    Comment: (NOTE) The Xpert Xpress SARS-CoV-2/FLU/RSV plus assay is intended as an aid in the diagnosis of influenza from Nasopharyngeal swab specimens and should not be used as a sole basis for treatment. Nasal washings and aspirates are unacceptable for Xpert Xpress SARS-CoV-2/FLU/RSV testing.  Fact Sheet for Patients: EntrepreneurPulse.com.au  Fact Sheet for Healthcare Providers: IncredibleEmployment.be  This test is not yet approved or cleared by the [place] FDA and has been authorized for detection and/or diagnosis of SARS-CoV-2 by FDA under an Emergency Use Authorization (EUA). This EUA will remain in effect (meaning this test can be used) for the duration of the COVID-19 declaration under Section 564(b)(1) of the Act, 21 U.S.C. section 360bbb-3(b)(1), unless the authorization is terminated or revoked.  Performed at [facility], [place] [address] [place], [place] [address]   MRSA PCR Screening     Status: None   Collection Time: 07/07/20 10:12 PM   Specimen: Nasal Mucosa; Nasopharyngeal  Result Value Ref Range Status   MRSA by PCR NEGATIVE NEGATIVE Final    Comment:        The GeneXpert MRSA Assay (FDA approved for NASAL specimens only), is one component of a comprehensive MRSA colonization surveillance program. It is not intended to diagnose MRSA infection nor to guide or monitor treatment for MRSA infections. Performed at [facility], [place] [address] [place], [place] [address]   Fungus Culture With Stain     Status: None (Preliminary result)   Collection Time: 07/09/20 10:55 AM   Specimen:  PATH Cytology washing; Body Fluid  Result Value Ref Range Status   Fungus Stain Final report  Final    Comment: (NOTE) Performed At: [facility] [place], [place] [id number] Rush Farmer MD [id number]:[id number]    Fungus (Mycology) Culture PENDING  Incomplete   Fungal Source BRONCHIAL ALVEOLAR LAVAGE  Final    Comment: RLL BAL SPEC A Performed at [facility], [place]  [address], [place], [place] [address]   Culture, Respiratory w Gram Stain     Status: None   Collection Time: 07/09/20 10:55 AM   Specimen: PATH Cytology washing; Body Fluid  Result Value Ref Range Status   Specimen Description BRONCHIAL ALVEOLAR LAVAGE  Final   Special Requests RLL BAL SPEC A  Final   Gram Stain NO WBC SEEN NO ORGANISMS SEEN   Final   Culture   Final    NO GROWTH Performed at [facility], [address], [place], [place] [address]    Report Status 07/11/2020 FINAL  Final  Acid Fast Smear (AFB)     Status: None   Collection Time: 07/09/20 10:55 AM   Specimen: PATH Cytology washing; Body Fluid  Result Value Ref Range Status   AFB Specimen Processing Concentration  Final   Acid Fast Smear Negative  Final    Comment: (NOTE) Performed At: [facility] [place], [place] [id number] Rush Farmer MD [id number]    Source (AFB) BRONCHIAL ALVEOLAR LAVAGE  Final    Comment: RLL BAL SPEC A Performed at [facility], [place] [address] [place], [place] [address]   Anaerobic culture w Gram Stain     Status: None (Preliminary result)   Collection Time: 07/09/20 10:55 AM   Specimen: Bronchoalveolar Lavage  Result Value Ref Range Status   Specimen Description BRONCHIAL ALVEOLAR LAVAGE  Final   Special Requests RLL SPEC A  Final   Gram Stain   Final    NO WBC SEEN NO ORGANISMS SEEN Performed at [facility], [place] [address] [place], [place] [address]    Culture   Final    NO ANAEROBES ISOLATED; CULTURE IN PROGRESS FOR 5 DAYS   Report  Status PENDING  Incomplete  Fungus Culture Result     Status: None   Collection Time: 07/09/20 10:55 AM  Result Value Ref Range Status   Result 1 Comment  Final    Comment: (NOTE) KOH/Calcofluor preparation:  no fungus observed. Performed At: [facility] [place], [place] [id number] Rush Farmer MD [id number]   Anaerobic culture w Gram Stain     Status: None (Preliminary result)   Collection Time: 07/09/20 10:58 AM   Specimen: Bronchoalveolar Lavage  Result Value Ref Range Status   Specimen Description BRONCHIAL ALVEOLAR LAVAGE  Final   Special Requests LLL SPEC C  Final   Gram Stain   Final    RARE WBC PRESENT, PREDOMINANTLY MONONUCLEAR NO ORGANISMS SEEN Performed at [facility], [place] [address] [place], [place] [address]    Culture   Final    NO ANAEROBES ISOLATED; CULTURE IN PROGRESS FOR 5 DAYS   Report Status PENDING  Incomplete  Acid Fast Smear (AFB)     Status: None   Collection Time: 07/09/20 10:59 AM   Specimen: PATH Cytology brushing; Body Fluid  Result Value Ref Range Status   AFB Specimen Processing Concentration  Final   Acid Fast Smear Negative  Final    Comment: (NOTE) Performed At: [facility] [place], [place] [id number] Rush Farmer MD [id number]    Source (AFB) LLL BRUSHING [facility] D  Final    Comment: Performed at [facility], [place] [address] [place], [place] [address]  Anaerobic culture w Gram Stain     Status: None (Preliminary result)   Collection Time: 07/09/20 10:59 AM   Specimen: PATH Cytology brushing; Body Fluid  Result  Value Ref Range Status   Specimen Description FLUID  Final   Special Requests LLL BRUSHING SPEC D  Final   Gram Stain   Final    NO WBC SEEN NO ORGANISMS SEEN Performed at [facility], [address] [place], [place] [address]    Culture   Final    NO ANAEROBES ISOLATED; CULTURE IN PROGRESS FOR 5 DAYS   Report Status PENDING  Incomplete  Culture,  BAL-quantitative w Gram Stain     Status: None   Collection Time: 07/09/20 10:59 AM   Specimen: Bronchial Brush  Result Value Ref Range Status   Specimen Description BRONCHIAL BRUSHING  Final   Special Requests LLL BRUSHING SPEC D  Final   Gram Stain NO WBC SEEN NO ORGANISMS SEEN   Final   Culture   Final    NO GROWTH Performed at [facility], [place] [address] [place], [place] [address]    Report Status 07/11/2020 FINAL  Final  Culture, fungus without smear     Status: None (Preliminary result)   Collection Time: 07/09/20  1:04 PM   Specimen: PATH Cytology brushing; Body Fluid  Result Value Ref Range Status   Specimen Description FLUID  Final   Special Requests RLL BRUSHING SPEC B  Final   Culture   Final    NO FUNGUS ISOLATED AFTER 5 DAYS Performed at [facility], [address], [place], [place] [address]    Report Status PENDING  Incomplete  Acid Fast Smear (AFB)     Status: None   Collection Time: 07/09/20  1:04 PM   Specimen: PATH Cytology brushing; Body Fluid  Result Value Ref Range Status   AFB Specimen Processing Concentration  Final   Acid Fast Smear Negative  Final    Comment: (NOTE) Performed At: [facility] [place], [place] [id number] Rush Farmer MD [id number]    Source (AFB) FLUID  Final    Comment: RLL BRUSHING SPEC B Performed at [facility], [place] [address], [place], [place] [address]   Anaerobic culture w Gram Stain     Status: None (Preliminary result)   Collection Time: 07/09/20  1:04 PM   Specimen: PATH Cytology brushing; Body Fluid  Result Value Ref Range Status   Specimen Description FLUID  Final   Special Requests RLL BRUSHING SPEC B  Final   Gram Stain   Final    NO WBC SEEN NO ORGANISMS SEEN Performed at [facility], [address] [place], [place] [address]    Culture   Final    NO ANAEROBES ISOLATED; CULTURE IN PROGRESS FOR 5 DAYS   Report Status PENDING  Incomplete  Culture,  BAL-quantitative w Gram Stain     Status: None   Collection Time: 07/09/20  1:04 PM   Specimen: Bronchial Brush  Result Value Ref Range Status   Specimen Description BRONCHIAL BRUSHING  Final   Special Requests RLL SPEC B  Final   Gram Stain NO WBC SEEN NO ORGANISMS SEEN   Final   Culture   Final    NO GROWTH Performed at [facility], [place] [address], [place], [place] [address]    Report Status 07/11/2020 FINAL  Final  Fungus Culture With Stain     Status: None (Preliminary result)   Collection Time: 07/09/20  1:31 PM   Specimen: PATH Cytology washing; Body Fluid  Result Value Ref Range Status   Fungus Stain  Final report  Final    Comment: (NOTE) Performed At: [facility] [place], [place] [id number] Rush Farmer MD [id number]    Fungus (Mycology) Culture PENDING  Incomplete   Fungal Source BRONCHIAL ALVEOLAR LAVAGE  Final    Comment: LLL SPEC C Performed at [facility], [place] [address], [place], [place] [address]   Culture, Respiratory w Gram Stain     Status: None   Collection Time: 07/09/20  1:31 PM   Specimen: PATH Cytology washing; Body Fluid  Result Value Ref Range Status   Specimen Description BRONCHIAL ALVEOLAR LAVAGE  Final   Special Requests LLL SPEC C  Final   Gram Stain   Final    RARE WBC PRESENT, PREDOMINANTLY MONONUCLEAR NO ORGANISMS SEEN    Culture   Final    NO GROWTH Performed at [facility], [address] [place], [place] [address]    Report Status 07/11/2020 FINAL  Final  Acid Fast Smear (AFB)     Status: None   Collection Time: 07/09/20  1:31 PM   Specimen: PATH Cytology washing; Body Fluid  Result Value Ref Range Status   AFB Specimen Processing Concentration  Final   Acid Fast Smear Negative  Final    Comment: (NOTE) Performed At: [facility] [place], [place] [id number] Rush Farmer MD [id number]    Source (AFB) BRONCHIAL ALVEOLAR LAVAGE  Final    Comment: LLL  SPEC C Performed at [facility], [place] [address], [place], [place] [address]   Fungus Culture Result     Status: None   Collection Time: 07/09/20  1:31 PM  Result Value Ref Range Status   Result 1 Comment  Final    Comment: (NOTE) KOH/Calcofluor preparation:  no fungus observed. Performed At: [facility] [place], [place] [id number] Rush Farmer MD [id number]          Radiology Studies: DG Chest Port 1 View  Result Date: 07/13/2020 CLINICAL DATA:  Abnormal respiration with shortness of breath EXAM: PORTABLE CHEST 1 VIEW COMPARISON:  Four days ago FINDINGS: Extensive bilateral airspace disease with left upper lobe progression. Lung volumes remain low. Normal heart size. No visible effusion or pneumothorax IMPRESSION: Bilateral airspace disease that is progressed from 4 days ago. Electronically Signed   By: Monte Fantasia M.D.   On: 07/13/2020 08:08        Scheduled Meds: . (feeding supplement) PROSource Plus  30 mL Oral BID BM  . acidophilus  1 capsule Oral Daily  . acyclovir  400 mg Oral Daily  . amLODipine  2.5 mg Oral Daily  . benzonatate  200 mg Oral BID  . buPROPion  100 mg Oral Daily  . Chlorhexidine Gluconate Cloth  6 each Topical Q0600  . [START ON 07/18/2020] darbepoetin (ARANESP) injection - DIALYSIS  60 mcg Intravenous Q Mon-HD  . escitalopram  20 mg Oral Daily  . feeding supplement (NEPRO CARB STEADY)  237 mL Oral BID BM  . fluticasone  1 spray Each Nare Daily  . guaiFENesin  1,200 mg Oral BID  . heparin  5,000 Units Subcutaneous Q12H  . ipratropium-albuterol  3 mL Nebulization QID  . losartan  50 mg Oral Daily  . mouth rinse  15 mL Mouth Rinse BID  . melatonin  5 mg Oral QHS  . multivitamin  1 tablet Oral QHS  . predniSONE  5 mg Oral Q breakfast  . senna-docusate  1 tablet  Oral Daily  . sevelamer carbonate  800 mg Oral TID WC  . simvastatin  10 mg Oral q1800  . Sirolimus  2 mg Oral Daily   Continuous Infusions: .  ceFEPime (MAXIPIME) IV 1 g (07/13/20 2134)     LOS: 7 days    Time spent: 25 minutes    Barb Merino, MD [facility] Pager [phone]

## 2020-07-14 NOTE — Plan of Care (Signed)

## 2020-07-15 LAB — CBC
HCT: 23.3 % — ABNORMAL LOW (ref 36.0–46.0)
Hemoglobin: 7.4 g/dL — ABNORMAL LOW (ref 12.0–15.0)
MCH: 28.8 pg (ref 26.0–34.0)
MCHC: 31.8 g/dL (ref 30.0–36.0)
MCV: 90.7 fL (ref 80.0–100.0)
Platelets: 420 10*3/uL — ABNORMAL HIGH (ref 150–400)
RBC: 2.57 MIL/uL — ABNORMAL LOW (ref 3.87–5.11)
RDW: 12.5 % (ref 11.5–15.5)
WBC: 8.9 10*3/uL (ref 4.0–10.5)
nRBC: 0 % (ref 0.0–0.2)

## 2020-07-15 LAB — RENAL FUNCTION PANEL
Albumin: 2.4 g/dL — ABNORMAL LOW (ref 3.5–5.0)
Anion gap: 14 (ref 5–15)
BUN: 38 mg/dL — ABNORMAL HIGH (ref 6–20)
CO2: 21 mmol/L — ABNORMAL LOW (ref 22–32)
Calcium: 8.6 mg/dL — ABNORMAL LOW (ref 8.9–10.3)
Chloride: 102 mmol/L (ref 98–111)
Creatinine, Ser: 9.18 mg/dL — ABNORMAL HIGH (ref 0.44–1.00)
GFR, Estimated: 5 mL/min — ABNORMAL LOW (ref >60)
Glucose, Bld: 87 mg/dL (ref 70–99)
Phosphorus: 4.2 mg/dL (ref 2.5–4.6)
Potassium: 3.4 mmol/L — ABNORMAL LOW (ref 3.5–5.1)
Sodium: 137 mmol/L (ref 135–145)

## 2020-07-15 MED ORDER — ACETAMINOPHEN 325 MG PO TABS
ORAL_TABLET | ORAL | Status: AC
  Filled 2020-07-15: qty 2

## 2020-07-15 NOTE — Progress Notes (Signed)
Walk test completed. Patient oxygen saturation 92% on 1L at rest. Patient ambulated and saturation dropped to 85% while walking. Placed on 2L and saturation recovered to 90%.

## 2020-07-15 NOTE — Progress Notes (Signed)
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  In the emergency room, she was 75% on room air.  CT chest showed multifocal pneumonia.  Patient was admitted to hospital with pulmonary consultation.  She underwent bronchoscopy and bronchoalveolar lavage.  Remains in the hospital with high oxygen requirement and need for IV antibiotics.   Assessment & Plan:   Active Problems:   Multifocal pneumonia  Multifocal pneumonia in immunocompromised , acute hypoxemic respiratory failure:  Patient was treated with vancomycin and cefepime.  Currently remains on cefepime day 8. Chest physiotherapy, incentive spirometry, deep breathing exercises, sputum induction, mucolytic's and bronchodilators. All cultures are negative so far. Supplemental oxygen to keep saturations more than 90%.  Oxygenation is improving. Patient is seen by PCCM and underwent bronchoscopy on 3/12, no positive growth so far. Continue to wean off. Complete 10 days of antibiotic therapy.  If she is able to come off oxygen today after dialysis, will discharge home with 2 more days of oral antibiotics.  ESRD on hemodialysis: Monday Wednesday Friday dialysis.  Followed by nephrology.  Renal transplant: Patient on tacrolimus and low-dose prednisone that is continued.  Hypertension: Blood pressure stable on amlodipine and losartan.  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  Continue.  Hemodialysis today.  Continue mobilizing and weaning off oxygen.  If able to wean off oxygen and walk, go home on oral  antibiotics.   DVT prophylaxis: heparin injection 5,000 Units Start: 07/07/20 2200   Code Status: Full code Family Communication: None today. Disposition Plan: Status is: Inpatient  Remains inpatient appropriate because:IV treatments appropriate due to intensity of illness or inability to take PO and Inpatient level of care appropriate due to severity of illness   Dispo: The patient is from: Home              Anticipated d/c is to: Home              Patient currently is not medically stable to d/c.   Difficult to place patient No         Consultants:   PCCM  Nephrology  Procedures:   None  Antimicrobials:   Vancomycin, stopped.  cefepime 3/10---   Subjective:  Patient seen and examined.  No overnight events.  Still on 1 to 2 L of oxygen and wants to get rid of it.  Going for dialysis.  Dry cough present.  Afebrile.  She is expecting to go home soon.  Objective: Vitals:   07/15/20 0925 07/15/20 0935 07/15/20 1000 07/15/20 1028  BP: 136/88 131/90 (!) 148/92 (!) 153/96  Pulse: (!) 105 95 95 99  Resp: (!) 22     Temp: 98 F (36.7 C)     TempSrc: Oral     SpO2: 97% 97% 94% 91%  Weight:  72.7 kg    Height:       No intake or output data in the 24 hours ending 07/15/20 1040 Filed Weights   07/13/20 0745 07/13/20 1105 07/15/20 0935  Weight: 76.8 kg 73.5 kg 72.7 kg    Examination: General: Looks comfortable.SpO2: 91 % O2 Flow Rate (  L/min): 1 L/min FiO2 (%): 40 % Cardiovascular: S1-S2 normal. Respiratory: Bilateral clear.  Currently on 1 to 2 L oxygen. Gastrointestinal: Soft and nontender.  Bowel sounds present. Ext: No edema or cyanosis.  Left upper extremity AV fistula present.     Data Reviewed: I have personally reviewed following labs and imaging studies  CBC: Recent Labs  Lab 07/09/20 0055 07/12/20 1112 07/13/20 0810 07/15/20 0945  WBC 8.4  --  10.1 8.9  HGB 8.6*  --  8.3* 7.4*  HCT 26.7*  --  25.1* 23.3*  MCV 89.9  --  90.0 90.7   PLT 424* 468* 453* 324*   Basic Metabolic Panel: Recent Labs  Lab 07/09/20 0055 07/13/20 0236 07/13/20 0810  NA 138  --  137  K 4.0  --  3.7  CL 99  --  100  CO2 29  --  24  GLUCOSE 84  --  93  BUN 5*  --  35*  CREATININE 4.47*  --  8.96*  CALCIUM 9.1  --  8.2*  MG  --  2.2  --   PHOS  --  3.6 4.0   GFR: Estimated Creatinine Clearance: 8.2 mL/min (A) (by C-G formula based on SCr of 8.96 mg/dL (H)). Liver Function Tests: Recent Labs  Lab 07/13/20 0810  ALBUMIN 2.5*   No results for input(s): LIPASE, AMYLASE in the last 168 hours. No results for input(s): AMMONIA in the last 168 hours. Coagulation Profile: No results for input(s): INR, PROTIME in the last 168 hours. Cardiac Enzymes: No results for input(s): CKTOTAL, CKMB, CKMBINDEX, TROPONINI in the last 168 hours. BNP (last 3 results) No results for input(s): PROBNP in the last 8760 hours. HbA1C: No results for input(s): HGBA1C in the last 72 hours. CBG: Recent Labs  Lab 07/10/20 0806 07/10/20 1133 07/10/20 1703 07/10/20 2134 07/11/20 0724  GLUCAP 135* 164* 135* 151* 124*   Lipid Profile: No results for input(s): CHOL, HDL, LDLCALC, TRIG, CHOLHDL, LDLDIRECT in the last 72 hours. Thyroid Function Tests: No results for input(s): TSH, T4TOTAL, FREET4, T3FREE, THYROIDAB in the last 72 hours. Anemia Panel: No results for input(s): VITAMINB12, FOLATE, FERRITIN, TIBC, IRON, RETICCTPCT in the last 72 hours. Sepsis Labs: No results for input(s): PROCALCITON, LATICACIDVEN in the last 168 hours.  Recent Results (from the past 240 hour(s))  Blood culture (routine x 2)     Status: None   Collection Time: 07/07/20  2:17 PM   Specimen: BLOOD  Result Value Ref Range Status   Specimen Description BLOOD RIGHT ANTECUBITAL  Final   Special Requests   Final    BOTTLES DRAWN AEROBIC AND ANAEROBIC Blood Culture results may not be optimal due to an excessive volume of blood received in culture bottles   Culture   Final    NO  GROWTH 5 DAYS Performed at [facility], [place] [address] [place], [place] [address]    Report Status 07/12/2020 FINAL  Final  Resp Panel by RT-PCR (Flu A&B, Covid) Nasopharyngeal Swab     Status: None   Collection Time: 07/07/20  2:34 PM   Specimen: Nasopharyngeal Swab; Nasopharyngeal(NP) swabs in vial transport medium  Result Value Ref Range Status   SARS Coronavirus 2 by RT PCR NEGATIVE NEGATIVE Final    Comment: (NOTE) SARS-CoV-2 target nucleic acids are NOT DETECTED.  The SARS-CoV-2 RNA is generally detectable in upper respiratory specimens during the acute phase of infection. The lowest concentration of SARS-CoV-2 viral copies this assay can detect is  138 copies/mL. A negative result does not preclude SARS-Cov-2 infection and should not be used as the sole basis for treatment or other patient management decisions. A negative result may occur with  improper specimen collection/handling, submission of specimen other than nasopharyngeal swab, presence of viral mutation(s) within the areas targeted by this assay, and inadequate number of viral copies(<138 copies/mL). A negative result must be combined with clinical observations, patient history, and epidemiological information. The expected result is Negative.  Fact Sheet for Patients:  EntrepreneurPulse.com.au  Fact Sheet for Healthcare Providers:  IncredibleEmployment.be  This test is no t yet approved or cleared by the [place] FDA and  has been authorized for detection and/or diagnosis of SARS-CoV-2 by FDA under an Emergency Use Authorization (EUA). This EUA will remain  in effect (meaning this test can be used) for the duration of the COVID-19 declaration under Section 564(b)(1) of the Act, 21 U.S.C.section 360bbb-3(b)(1), unless the authorization is terminated  or revoked sooner.       Influenza A by PCR NEGATIVE NEGATIVE Final   Influenza B by PCR NEGATIVE NEGATIVE Final     Comment: (NOTE) The Xpert Xpress SARS-CoV-2/FLU/RSV plus assay is intended as an aid in the diagnosis of influenza from Nasopharyngeal swab specimens and should not be used as a sole basis for treatment. Nasal washings and aspirates are unacceptable for Xpert Xpress SARS-CoV-2/FLU/RSV testing.  Fact Sheet for Patients: EntrepreneurPulse.com.au  Fact Sheet for Healthcare Providers: IncredibleEmployment.be  This test is not yet approved or cleared by the [place] FDA and has been authorized for detection and/or diagnosis of SARS-CoV-2 by FDA under an Emergency Use Authorization (EUA). This EUA will remain in effect (meaning this test can be used) for the duration of the COVID-19 declaration under Section 564(b)(1) of the Act, 21 U.S.C. section 360bbb-3(b)(1), unless the authorization is terminated or revoked.  Performed at [facility], [place] [address], [place], [place] [address]   MRSA PCR Screening     Status: None   Collection Time: 07/07/20 10:12 PM   Specimen: Nasal Mucosa; Nasopharyngeal  Result Value Ref Range Status   MRSA by PCR NEGATIVE NEGATIVE Final    Comment:        The GeneXpert MRSA Assay (FDA approved for NASAL specimens only), is one component of a comprehensive MRSA colonization surveillance program. It is not intended to diagnose MRSA infection nor to guide or monitor treatment for MRSA infections. Performed at [facility], [place] [address], [place], [place] [address]   Fungus Culture With Stain     Status: None (Preliminary result)   Collection Time: 07/09/20 10:55 AM   Specimen: PATH Cytology washing; Body Fluid  Result Value Ref Range Status   Fungus Stain Final report  Final    Comment: (NOTE) Performed At: [facility] [place], [place] [id number] Rush Farmer MD [id number]    Fungus (Mycology) Culture PENDING  Incomplete   Fungal Source BRONCHIAL ALVEOLAR LAVAGE   Final    Comment: RLL BAL SPEC A Performed at [facility], [place] [address], [place], [place] [address]   Culture, Respiratory w Gram Stain     Status: None   Collection Time: 07/09/20 10:55 AM   Specimen: PATH Cytology washing; Body Fluid  Result Value Ref Range Status   Specimen Description BRONCHIAL ALVEOLAR LAVAGE  Final   Special Requests RLL BAL SPEC A  Final   Gram Stain NO WBC SEEN NO ORGANISMS SEEN   Final  Culture   Final    NO GROWTH Performed at [facility], [place] [address], [place], [place] [address]    Report Status 07/11/2020 FINAL  Final  Acid Fast Smear (AFB)     Status: None   Collection Time: 07/09/20 10:55 AM   Specimen: PATH Cytology washing; Body Fluid  Result Value Ref Range Status   AFB Specimen Processing Concentration  Final   Acid Fast Smear Negative  Final    Comment: (NOTE) Performed At: [facility] [place], [place] [id number] Rush Farmer MD YB:[id number]    Source (AFB) BRONCHIAL ALVEOLAR LAVAGE  Final    Comment: RLL BAL SPEC A Performed at [facility], [place] [address] [place], [place] [address]   Anaerobic culture w Gram Stain     Status: None   Collection Time: 07/09/20 10:55 AM   Specimen: Bronchoalveolar Lavage  Result Value Ref Range Status   Specimen Description BRONCHIAL ALVEOLAR LAVAGE  Final   Special Requests RLL SPEC A  Final   Gram Stain NO WBC SEEN NO ORGANISMS SEEN   Final   Culture   Final    NO ANAEROBES ISOLATED Performed at [facility], [place] [address] [place], [place] [address]    Report Status 07/14/2020 FINAL  Final  Fungus Culture Result     Status: None   Collection Time: 07/09/20 10:55 AM  Result Value Ref Range Status   Result 1 Comment  Final    Comment: (NOTE) KOH/Calcofluor preparation:  no fungus observed. Performed At: [facility] [place], [place] [id number] Rush Farmer MD GQ:[id number]   Anaerobic culture w Gram Stain      Status: None   Collection Time: 07/09/20 10:58 AM   Specimen: Bronchoalveolar Lavage  Result Value Ref Range Status   Specimen Description BRONCHIAL ALVEOLAR LAVAGE  Final   Special Requests LLL SPEC C  Final   Gram Stain   Final    RARE WBC PRESENT, PREDOMINANTLY MONONUCLEAR NO ORGANISMS SEEN    Culture   Final    NO ANAEROBES ISOLATED Performed at [facility], [place] [address], [place], [place] [address]    Report Status 07/14/2020 FINAL  Final  Acid Fast Smear (AFB)     Status: None   Collection Time: 07/09/20 10:59 AM   Specimen: PATH Cytology brushing; Body Fluid  Result Value Ref Range Status   AFB Specimen Processing Concentration  Final   Acid Fast Smear Negative  Final    Comment: (NOTE) Performed At: [facility] [place], [place] [id number] Rush Farmer MD SN:[id number]    Source (AFB) LLL BRUSHING [facility] D  Final    Comment: Performed at [facility], [place] [address], [place], [place] [address]  Anaerobic culture w Gram Stain     Status: None   Collection Time: 07/09/20 10:59 AM   Specimen: PATH Cytology brushing; Body Fluid  Result Value Ref Range Status   Specimen Description FLUID  Final   Special Requests LLL BRUSHING SPEC D  Final   Gram Stain NO WBC SEEN NO ORGANISMS SEEN   Final   Culture   Final    NO ANAEROBES ISOLATED Performed at [facility], [address], [place], [place] [address]    Report Status 07/14/2020 FINAL  Final  Culture, BAL-quantitative w Gram Stain     Status: None   Collection Time: 07/09/20 10:59 AM   Specimen:  Bronchial Brush  Result Value Ref Range Status   Specimen Description BRONCHIAL BRUSHING  Final   Special Requests LLL BRUSHING SPEC D  Final   Gram Stain NO WBC SEEN NO ORGANISMS SEEN   Final   Culture   Final    NO GROWTH Performed at [facility], [address] [place], [place] [address]    Report Status 07/11/2020 FINAL  Final  Culture, fungus without smear      Status: None (Preliminary result)   Collection Time: 07/09/20  1:04 PM   Specimen: PATH Cytology brushing; Body Fluid  Result Value Ref Range Status   Specimen Description FLUID  Final   Special Requests RLL BRUSHING SPEC B  Final   Culture   Final    NO FUNGUS ISOLATED AFTER 5 DAYS Performed at [facility], [address] [place], [place] [address]    Report Status PENDING  Incomplete  Acid Fast Smear (AFB)     Status: None   Collection Time: 07/09/20  1:04 PM   Specimen: PATH Cytology brushing; Body Fluid  Result Value Ref Range Status   AFB Specimen Processing Concentration  Final   Acid Fast Smear Negative  Final    Comment: (NOTE) Performed At: [facility] [place], [place] [id number] Rush Farmer MD IR:[id number]    Source (AFB) FLUID  Final    Comment: RLL BRUSHING SPEC B Performed at [facility], [place] [address] [place], [place] [address]   Anaerobic culture w Gram Stain     Status: None   Collection Time: 07/09/20  1:04 PM   Specimen: PATH Cytology brushing; Body Fluid  Result Value Ref Range Status   Specimen Description FLUID  Final   Special Requests RLL BRUSHING SPEC B  Final   Gram Stain NO WBC SEEN NO ORGANISMS SEEN   Final   Culture   Final    NO ANAEROBES ISOLATED Performed at [facility], [address] [place], [place] [address]    Report Status 07/14/2020 FINAL  Final  Culture, BAL-quantitative w Gram Stain     Status: None   Collection Time: 07/09/20  1:04 PM   Specimen: Bronchial Brush  Result Value Ref Range Status   Specimen Description BRONCHIAL BRUSHING  Final   Special Requests RLL SPEC B  Final   Gram Stain NO WBC SEEN NO ORGANISMS SEEN   Final   Culture   Final    NO GROWTH Performed at [facility], [place] [address] [place], [place] [address]    Report Status 07/11/2020 FINAL  Final  Fungus Culture With Stain     Status: None (Preliminary result)   Collection Time: 07/09/20  1:31  PM   Specimen: PATH Cytology washing; Body Fluid  Result Value Ref Range Status   Fungus Stain Final report  Final    Comment: (NOTE) Performed At: [facility] [place], [place] [id number] Rush Farmer MD QM:[id number]    Fungus (Mycology) Culture PENDING  Incomplete   Fungal Source BRONCHIAL ALVEOLAR LAVAGE  Final    Comment: LLL SPEC C Performed at [facility], [place] [address], [place], [place] [address]   Culture, Respiratory w Gram Stain     Status: None   Collection Time: 07/09/20  1:31 PM   Specimen: PATH Cytology washing; Body Fluid  Result Value Ref Range Status   Specimen Description BRONCHIAL ALVEOLAR LAVAGE  Final  Special Requests LLL SPEC C  Final   Gram Stain   Final    RARE WBC PRESENT, PREDOMINANTLY MONONUCLEAR NO ORGANISMS SEEN    Culture   Final    NO GROWTH Performed at [facility], [place] [address] [place], [place] [address]    Report Status 07/11/2020 FINAL  Final  Acid Fast Smear (AFB)     Status: None   Collection Time: 07/09/20  1:31 PM   Specimen: PATH Cytology washing; Body Fluid  Result Value Ref Range Status   AFB Specimen Processing Concentration  Final   Acid Fast Smear Negative  Final    Comment: (NOTE) Performed At: [facility] [place], [place] [id number] Rush Farmer MD [id number]:[id number]    Source (AFB) BRONCHIAL ALVEOLAR LAVAGE  Final    Comment: LLL SPEC C Performed at [facility], [place] [address] [place], [place] [address]   Fungus Culture Result     Status: None   Collection Time: 07/09/20  1:31 PM  Result Value Ref Range Status   Result 1 Comment  Final    Comment: (NOTE) KOH/Calcofluor preparation:  no fungus observed. Performed At: [facility] [place], [place] [id number] Rush Farmer MD YS:[id number]          Radiology Studies: No results found.      Scheduled Meds: . acidophilus  1 capsule Oral Daily  . acyclovir   400 mg Oral Daily  . amLODipine  2.5 mg Oral Daily  . benzonatate  200 mg Oral BID  . buPROPion  100 mg Oral Daily  . Chlorhexidine Gluconate Cloth  6 each Topical Q0600  . [START ON 07/18/2020] darbepoetin (ARANESP) injection - DIALYSIS  60 mcg Intravenous Q Mon-HD  . escitalopram  20 mg Oral Daily  . feeding supplement  1 Container Oral TID BM  . fluticasone  1 spray Each Nare Daily  . guaiFENesin  1,200 mg Oral BID  . heparin  5,000 Units Subcutaneous Q12H  . ipratropium-albuterol  3 mL Nebulization QID  . losartan  50 mg Oral Daily  . mouth rinse  15 mL Mouth Rinse BID  . melatonin  5 mg Oral QHS  . multivitamin  1 tablet Oral QHS  . predniSONE  5 mg Oral Q breakfast  . senna-docusate  1 tablet Oral Daily  . sevelamer carbonate  800 mg Oral TID WC  . simvastatin  10 mg Oral q1800  . Sirolimus  2 mg Oral Daily   Continuous Infusions: . ceFEPime (MAXIPIME) IV 1 g (07/14/20 2117)     LOS: 8 days    Time spent: 25 minutes    Barb Merino, MD [facility] Pager [phone]

## 2020-07-15 NOTE — Progress Notes (Signed)
[facility] NEPHROLOGY PROGRESS NOTE  Assessment/ Plan:  # Multifocal pneumonia with acute hypoxic respiratory failure: On broad-spectrum antimicrobial therapy with cefepime after previously being on Vanc.  Decreasing oxygen requirements noted via nasal cannula at this time.  Cultures so far negative to date.  Still having coughing and short of breath.  # ESRD ([facility]): With history of failed renal transplant and now back on renal replacement therapy with hemodialysis.  Continue Monday/Wednesday/Friday schedule with dialysis planned for today via left forearm AV fistula.  She remains on sirolimus and low-dose prednisone.  # Anemia: Without overt blood loss, defer intravenous iron (ferritin 630 with iron saturation 17%) until infection resolved and monitor on ESA.  # CKD-MBD: Current calcium and phosphorus level within acceptable range on low-dose sevelamer, will continue to follow trends.  # Nutrition: Continue renal diet with fluid restriction, on renal multivitamin.  # Hypertension: Blood pressure within acceptable range, continue to monitor with hemodialysis/UF.  Subjective: Seen and examined at bedside.  Patient was having cough requiring oxygen.  She feels much better than before.  Plan for dialysis today.  No other major event. Objective Vital signs in last 24 hours: Vitals:   07/14/20 2359 07/15/20 0345 07/15/20 0736 07/15/20 0755  BP: 138/87 136/89 (!) 163/104   Pulse:   (!) 122 (!) 103  Resp: 19 19 18  (!) 22  Temp: 98.5 F (36.9 C) 98.7 F (37.1 C) 98.5 F (36.9 C)   TempSrc: Oral Oral Oral   SpO2: 96% 94% 90% 97%  Weight:      Height:       Weight change:   Intake/Output Summary (Last 24 hours) at 07/15/2020 0827 Last data filed at 07/14/2020 0849 Gross per 24 hour  Intake 120 ml  Output --  Net 120 ml       Labs: Basic Metabolic Panel: Recent Labs  Lab 07/09/20 0055 07/13/20 0236 07/13/20 0810  NA 138  --  137  K 4.0  --  3.7  CL 99   --  100  CO2 29  --  24  GLUCOSE 84  --  93  BUN 5*  --  35*  CREATININE 4.47*  --  8.96*  CALCIUM 9.1  --  8.2*  PHOS  --  3.6 4.0   Liver Function Tests: Recent Labs  Lab 07/13/20 0810  ALBUMIN 2.5*   No results for input(s): LIPASE, AMYLASE in the last 168 hours. No results for input(s): AMMONIA in the last 168 hours. CBC: Recent Labs  Lab 07/09/20 0055 07/12/20 1112 07/13/20 0810  WBC 8.4  --  10.1  HGB 8.6*  --  8.3*  HCT 26.7*  --  25.1*  MCV 89.9  --  90.0  PLT 424* 468* 453*   Cardiac Enzymes: No results for input(s): CKTOTAL, CKMB, CKMBINDEX, TROPONINI in the last 168 hours. CBG: Recent Labs  Lab 07/10/20 0806 07/10/20 1133 07/10/20 1703 07/10/20 2134 07/11/20 0724  GLUCAP 135* 164* 135* 151* 124*    Iron Studies: No results for input(s): IRON, TIBC, TRANSFERRIN, FERRITIN in the last 72 hours. Studies/Results: No results found.  Medications: Infusions: . ceFEPime (MAXIPIME) IV 1 g (07/14/20 2117)    Scheduled Medications: . acidophilus  1 capsule Oral Daily  . acyclovir  400 mg Oral Daily  . amLODipine  2.5 mg Oral Daily  . benzonatate  200 mg Oral BID  . buPROPion  100 mg Oral Daily  . Chlorhexidine Gluconate Cloth  6 each Topical Q0600  . [  START ON 07/18/2020] darbepoetin (ARANESP) injection - DIALYSIS  60 mcg Intravenous Q Mon-HD  . escitalopram  20 mg Oral Daily  . feeding supplement  1 Container Oral TID BM  . fluticasone  1 spray Each Nare Daily  . guaiFENesin  1,200 mg Oral BID  . heparin  5,000 Units Subcutaneous Q12H  . ipratropium-albuterol  3 mL Nebulization QID  . losartan  50 mg Oral Daily  . mouth rinse  15 mL Mouth Rinse BID  . melatonin  5 mg Oral QHS  . multivitamin  1 tablet Oral QHS  . predniSONE  5 mg Oral Q breakfast  . senna-docusate  1 tablet Oral Daily  . sevelamer carbonate  800 mg Oral TID WC  . simvastatin  10 mg Oral q1800  . Sirolimus  2 mg Oral Daily    have reviewed scheduled and prn  medications.  Physical Exam: General:NAD, comfortable Heart:RRR, s1s2 nl Lungs: Coarse breath sound bibasilar, Abdomen:soft, Non-tender, non-distended Extremities: Trace LE edema Dialysis Access: Left upper extremity has AV fistula with some thrill and bruit.  Tanav Orsak Reesa Chew Sundeep Destin 07/15/2020,8:27 AM  LOS: 8 days

## 2020-07-16 ENCOUNTER — Telehealth: Payer: Self-pay | Admitting: Pulmonary Disease

## 2020-07-16 ENCOUNTER — Inpatient Hospital Stay (HOSPITAL_COMMUNITY): Payer: Managed Care, Other (non HMO)

## 2020-07-16 LAB — PREPARE RBC (CROSSMATCH)

## 2020-07-16 LAB — ABO/RH: ABO/RH(D): O POS

## 2020-07-16 MED ORDER — AMLODIPINE BESYLATE 2.5 MG PO TABS: 2.5000 mg | ORAL_TABLET | Freq: Every day | ORAL | 0 refills | Status: DC

## 2020-07-16 MED ORDER — IPRATROPIUM-ALBUTEROL 0.5-2.5 (3) MG/3ML IN SOLN: 3.0000 mL | RESPIRATORY_TRACT | Status: DC | PRN

## 2020-07-16 MED ORDER — SODIUM CHLORIDE 0.9% IV SOLUTION: Freq: Once | INTRAVENOUS | Status: AC

## 2020-07-16 MED ORDER — BENZONATATE 200 MG PO CAPS: 200.0000 mg | ORAL_CAPSULE | Freq: Two times a day (BID) | ORAL | 0 refills | Status: DC

## 2020-07-16 NOTE — Progress Notes (Signed)
Pharmacy Antibiotic Note  Kaitlyn Schwartz is a 35 y.o. female admitted on 07/07/2020 with pneumonia s./p bronch. Patient has history of ESRD on HD MWF and kidney transplant. Pharmacy has been consulted for cefepime dosing.   The patient was initiated on Cefepime on 3/10 - today is D#10 of treatment. All cultures have been negative. ESRD-MWF. No adjustments to dose needed.   Plan: Recommend continuing Cefepime 1g IV q24h through today (3/19) to complete 10 days of therapy.  Will continue to follow HD schedule/duration, culture results, LOT, and antibiotic de-escalation plans    Height: 5\' 2"  (157.5 cm) Weight: 72 kg (158 lb 11.7 oz) IBW/kg (Calculated) : 50.1  Temp (24hrs), Avg:98.1 F (36.7 C), Min:97.8 F (36.6 C), Max:98.3 F (36.8 C)  Recent Labs  Lab 07/13/20 0810 07/15/20 0945  WBC 10.1 8.9  CREATININE 8.96* 9.18*    Estimated Creatinine Clearance: 8 mL/min (A) (by C-G formula based on SCr of 9.18 mg/dL (H)).    Allergies  Allergen Reactions  . Augmentin [Amoxicillin-Pot Clavulanate] Diarrhea and Nausea And Vomiting  . Lisinopril Shortness Of Breath    Other reaction(s): Cough   . Vancomycin Itching  . Versed [Midazolam] Anxiety  . Azithromycin     IV only  . Cellcept [Mycophenolate] Diarrhea  . Dobutamine     Other reaction(s): HTN, bradycardia  . Ibuprofen     Other reaction(s): *Unknown Pt was told not to take ibuprofen after kidney transplant - Sep 10, 2003    Antimicrobials this admission: Vancomycin 3/10 > 3/13 Cefepime 3/10 >>  Dose adjustments this admission: n/a  Microbiology results: 3/10 Bcx: negative  3/13 bronch: negative  MRSA PCR negative   Thank you for allowing pharmacy to be a part of this patient's care.  Claudina Lick, PharmD PGY1 Acute Care Pharmacy Resident 07/16/2020 9:55 AM  Please check [email or web address] for unit-specific pharmacy phone numbers.

## 2020-07-16 NOTE — Discharge Summary (Signed)
Physician Discharge Summary  Kaitlyn Schwartz WUX:[id number] DOB: 1985/08/31 DOA: 07/07/2020  PCP: Marda Stalker, PA-C  Admit date: 07/07/2020 Discharge date: 07/17/2020  Admitted From: Home Disposition: Home  Recommendations for Outpatient Follow-up:  1. Follow up with PCP in 1-2 weeks 2. Keep up with outpatient dialysis schedule. 3. You will follow-up with pulmonary clinic, they will schedule for sleep apnea test.  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  In the emergency room, she was 75% on room air.  CT chest showed multifocal pneumonia.  Patient was admitted to hospital with pulmonary consultation.  She underwent bronchoscopy and bronchoalveolar lavage.  Remained in the hospital with oxygen requirement and for IV antibiotics.  Assessment and plan of care:   # Multifocal pneumonia in immunocompromised , acute hypoxemic respiratory failure:  Patient was treated with vancomycin and cefepime.  Completed 10 days of cefepime on 3/19.Marland Kitchen Clinically improving.  Dry cough present. Oxygenation improving, on 1 to 2 L oxygen at rest, 6 to 8 L oxygen on exertion. Will be going home with supplemental oxygen. No further antibiotics needed. Symptomatic treatment, cough medications, humidification and mobility. Pulmonary clinic will schedule follow-up.  # ESRD on hemodialysis: Monday Wednesday Friday dialysis.    Next dialysis Monday.  # Renal transplant: Patient on tacrolimus and low-dose prednisone that is  continued.  # Hypertension: Blood pressure stable on amlodipine and losartan.  Her blood pressures were low normal.  Amlodipine dose has been reduced from 10 mg daily to 2.5 mg daily.  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  Continue.  #Anemia of chronic disease: Patient has significant anemia with hemoglobin 7.4.  Baseline hemoglobin of 9.5.  Getting ESA through dialysis.  With exertional hypoxia, discussed about benefits versus risk of blood transfusion and it was thought that she will benefit with 1 unit of PRBC to improve her oxygenation.  Transfuse 1 unit PRBC before discharge.  Volume is optimized.  Next dialysis is on 3/21.  Discharged home with family.     Discharge Diagnoses:  Active Problems:   Multifocal pneumonia    Discharge Instructions  Discharge Instructions    Call MD for:  difficulty breathing, headache or visual disturbances   Complete by: As directed    Diet - low sodium heart healthy   Complete by: As directed    Discharge instructions   Complete by: As directed    There is change in blood pressure medicine.  New medicine sent to pharmacy. Can use cough medicine.  Over-the-counter Mucinex and similar medications. Continue to do breathing exercises at home. Oxygen as ordered.   Increase activity slowly   Complete by: As directed      Allergies as of 07/16/2020      Reactions   Augmentin [amoxicillin-pot Clavulanate] Diarrhea, Nausea And Vomiting   Lisinopril Shortness Of Breath   Other reaction(s): Cough   Vancomycin Itching   Versed [midazolam] Anxiety   Azithromycin    IV only   Cellcept [mycophenolate] Diarrhea   Dobutamine    Other reaction(s): HTN, bradycardia   Ibuprofen    Other reaction(s): *Unknown Pt was  told not to take ibuprofen after kidney transplant - Sep 10, 2003      Medication List    STOP taking these medications   doxycycline 100 MG capsule Commonly known as: VIBRAMYCIN     TAKE these medications   acetaminophen  500 MG tablet Commonly known as: TYLENOL Take 1,000 mg by mouth every 8 (eight) hours as needed for fever or moderate pain.   acyclovir 400 MG tablet Commonly known as: ZOVIRAX Take 400 mg by mouth daily.   albuterol 108 (90 Base) MCG/ACT inhaler Commonly known as: VENTOLIN HFA Inhale 2 puffs into the lungs every 4 (four) hours as needed for wheezing.   amLODipine 2.5 MG tablet Commonly known as: NORVASC Take 1 tablet (2.5 mg total) by mouth daily. What changed:   medication strength  how much to take   benzonatate 200 MG capsule Commonly known as: TESSALON Take 1 capsule (200 mg total) by mouth 2 (two) times daily.   buPROPion 100 MG tablet Commonly known as: WELLBUTRIN Take 100 mg by mouth daily.   calcium carbonate 1250 (500 Ca) MG chewable tablet Commonly known as: OS-CAL Chew 2 tablets by mouth daily as needed for heartburn.   Culturelle Caps Take 1 capsule by mouth daily at 6 (six) AM.   diphenhydrAMINE 25 MG tablet Commonly known as: BENADRYL Take 25 mg by mouth daily as needed for itching or allergies.   escitalopram 20 MG tablet Commonly known as: LEXAPRO Take 20 mg by mouth daily.   famotidine 10 MG tablet Commonly known as: PEPCID Take 10 mg by mouth daily.   gabapentin 300 MG capsule Commonly known as: NEURONTIN Take 300 mg by mouth See admin instructions. Takes only on Dialysis days ( Monday, Wednesday ana Friday)   losartan 50 MG tablet Commonly known as: COZAAR Take 50 mg by mouth daily.   melatonin 5 MG Tabs Take 5 mg by mouth at bedtime.   methylphenidate 18 MG CR tablet Commonly known as: CONCERTA Take 18 mg by mouth daily. Takes only Monday through Friday.   multivitamin Tabs tablet Take 1 tablet by mouth daily.   norethindrone 5 MG tablet Commonly known as: AYGESTIN Take 5 mg by mouth daily.   ondansetron 4 MG tablet Commonly known as: ZOFRAN Take 4 mg by mouth every 8 (eight) hours as needed for nausea or vomiting.    predniSONE 5 MG tablet Commonly known as: DELTASONE Take 5 mg by mouth daily.   sevelamer 800 MG tablet Commonly known as: RENAGEL Take 1,600 mg by mouth 3 (three) times daily.   simvastatin 10 MG tablet Commonly known as: ZOCOR Take 10 mg by mouth daily.   sirolimus 1 MG tablet Commonly known as: RAPAMUNE Take 2 mg by mouth daily.   SUMAtriptan 50 MG tablet Commonly known as: IMITREX Take 50 mg by mouth daily as needed for migraine.       Follow-up Information    Marda Stalker, PA-C Follow up in 2 week(s).   Specialty: Family Medicine Contact information: [place] [address] [phone]              Allergies  Allergen Reactions  . Augmentin [Amoxicillin-Pot Clavulanate] Diarrhea and Nausea And Vomiting  . Lisinopril Shortness Of Breath    Other reaction(s): Cough   . Vancomycin Itching  . Versed [Midazolam] Anxiety  . Azithromycin     IV only  . Cellcept [Mycophenolate] Diarrhea  . Dobutamine     Other reaction(s): HTN, bradycardia  .  Ibuprofen     Other reaction(s): *Unknown Pt was told not to take ibuprofen after kidney transplant - Sep 10, 2003    Consultations:  PCCM  Nephrology   Procedures/Studies: CT ABDOMEN PELVIS WO CONTRAST  Result Date: 07/03/2020 CLINICAL DATA:  Abdominal pain EXAM: CT ABDOMEN AND PELVIS WITHOUT CONTRAST TECHNIQUE: Multidetector CT imaging of the abdomen and pelvis was performed following the standard protocol without IV contrast. COMPARISON:  None. FINDINGS: Lower chest: Bibasilar consolidative airspace opacities. Trace RIGHT pleural effusion. Hepatobiliary: Unremarkable noncontrast appearance of the liver. Gallbladder is unremarkable. Pancreas: No peripancreatic fat stranding. Spleen: Lobulated spleen. Adrenals/Urinary Tract: Adrenal glands are unremarkable. Atrophic native kidneys with incompletely characterized subcentimeter hypodense lesions of the LEFT kidney. Renal transplant in the RIGHT  lower quadrant without evidence of hydronephrosis. Bladder is completely decompressed. Mild fat stranding adjacent to the bladder. Stomach/Bowel: Appendix is normal. No evidence of bowel obstruction. There is mild circumferential colonic wall thickening most pronounced in the transverse and descending colon with mild adjacent fat stranding. Vascular/Lymphatic: No significant vascular findings are present. No enlarged abdominal or pelvic lymph nodes. Reproductive: Uterus and bilateral adnexa are unremarkable. Other: Trace free fluid. Musculoskeletal: No acute or significant osseous findings. IMPRESSION: 1. Mild circumferential colonic wall thickening most pronounced in the transverse and descending colon with mild adjacent fat stranding. This is nonspecific and could reflect an infectious or inflammatory colitis in the appropriate clinical setting. 2. Mild fat stranding adjacent to the bladder. Correlate with urinalysis to exclude cystitis. 3. Bibasilar consolidative airspace opacities and trace RIGHT pleural effusion, consistent with history of pneumonia. 4. Normal appendix. Electronically Signed   By: Valentino Saxon MD   On: 07/03/2020 12:59   DG Chest 1 View  Result Date: 07/09/2020 CLINICAL DATA:  Respiratory distress syndrome EXAM: CHEST  1 VIEW COMPARISON:  07/07/2020 FINDINGS: Stable cardiomediastinal contours. Continued progression of bilateral airspace opacities, most confluent within the lung bases. Small bilateral pleural effusions. No pneumothorax. IMPRESSION: Continued progression of bilateral airspace opacities, most confluent within the lung bases. Electronically Signed   By: Davina Poke D.O.   On: 07/09/2020 12:48   CT Angio Chest PE W/Cm &/Or Wo Cm  Result Date: 07/07/2020 CLINICAL DATA:  Pneumonia 3 weeks ago shortness of breath EXAM: CT ANGIOGRAPHY CHEST WITH CONTRAST TECHNIQUE: Multidetector CT imaging of the chest was performed using the standard protocol during bolus  administration of intravenous contrast. Multiplanar CT image reconstructions and MIPs were obtained to evaluate the vascular anatomy. CONTRAST:  66m OMNIPAQUE IOHEXOL 350 MG/ML SOLN COMPARISON:  None. FINDINGS: Cardiovascular: There is a optimal opacification of the pulmonary arteries. There is no central,segmental, or subsegmental filling defects within the pulmonary arteries. The heart is normal in size. A small pericardial effusion is seen. No evidence right heart strain. There is normal three-vessel brachiocephalic anatomy without proximal stenosis. The thoracic aorta is normal in appearance. Mediastinum/Nodes: No hilar, mediastinal, or axillary adenopathy. Thyroid gland, trachea, and esophagus demonstrate no significant findings. Lungs/Pleura: Extensive multifocal patchy ground-glass opacities are seen throughout both lungs. There is a small right and trace left pleural effusion present. Upper Abdomen: No acute abnormalities present in the visualized portions of the upper abdomen. Musculoskeletal: No chest wall abnormality. No acute or significant osseous findings. Mild S-shaped scoliotic curvature is noted. Review of the MIP images confirms the above findings. IMPRESSION: No central, segmental, or subsegmental pulmonary embolism Extensive multifocal airspace opacities, consistent with multifocal pneumonia Small pericardial effusion Small right and trace left pleural effusion. Electronically Signed  By: Prudencio Pair M.D.   On: 07/07/2020 16:56   DG CHEST PORT 1 VIEW  Result Date: 07/16/2020 CLINICAL DATA:  Acute respiratory failure. EXAM: PORTABLE CHEST 1 VIEW COMPARISON:  Single-view of the chest 07/09/2020 and 07/13/2020. FINDINGS: There is patchy bilateral airspace disease which is markedly improved compared to the most recent exam. Small right pleural effusion is noted. No pneumothorax. Heart size is normal. IMPRESSION: Marked improvement in bilateral airspace disease. Small right pleural effusion  noted. Electronically Signed   By: Inge Rise M.D.   On: 07/16/2020 12:22   DG Chest Port 1 View  Result Date: 07/13/2020 CLINICAL DATA:  Abnormal respiration with shortness of breath EXAM: PORTABLE CHEST 1 VIEW COMPARISON:  Four days ago FINDINGS: Extensive bilateral airspace disease with left upper lobe progression. Lung volumes remain low. Normal heart size. No visible effusion or pneumothorax IMPRESSION: Bilateral airspace disease that is progressed from 4 days ago. Electronically Signed   By: Monte Fantasia M.D.   On: 07/13/2020 08:08   DG Chest Portable 1 View  Result Date: 07/07/2020 CLINICAL DATA:  Shortness of breath EXAM: PORTABLE CHEST 1 VIEW COMPARISON:  July 03, 2020 FINDINGS: There is airspace opacity in the lower lobe regions, slightly progressed. There is new infiltrate throughout portions of the left and right mid lung regions and portions of the right upper lobe. Heart size and pulmonary vascular normal. No adenopathy. No bone lesions. IMPRESSION: Multifocal pneumonia with progression of infiltrate in the right upper lobe as well as in the mid lung regions. Extensive infiltrate in each lower lobe persists without significant change from recent study. Question atypical organism pneumonia given this appearance. Check of COVID-19 status advised. Heart size normal.  No adenopathy evident. Electronically Signed   By: Lowella Grip III M.D.   On: 07/07/2020 15:08   DG Chest Portable 1 View  Result Date: 07/03/2020 CLINICAL DATA:  Cough and shortness of breath. EXAM: PORTABLE CHEST 1 VIEW COMPARISON:  None. FINDINGS: The cardiomediastinal silhouette is unremarkable. Patchy airspace opacities within both mid and lower lungs are noted. There is no evidence of pleural effusion, pneumothorax or acute bony abnormality. IMPRESSION: Patchy bilateral airspace opacities compatible with infection/pneumonia. Electronically Signed   By: Margarette Canada M.D.   On: 07/03/2020 10:44   (Echo, Carotid,  EGD, Colonoscopy, ERCP)    Subjective: Patient seen and examined.  Multiple questions.  She has dry cough.  Her wife is at the bedside. No fever.  Feels okay at rest, tired and slightly short of breath on mobility.  Wants to go home.   Discharge Exam: Vitals:   07/16/20 1810 07/16/20 1815  BP: (!) 130/94   Pulse: 89 95  Resp: (!) 22 17  Temp: 98.3 F (36.8 C)   SpO2: 99% 95%   Vitals:   07/16/20 1431 07/16/20 1459 07/16/20 1810 07/16/20 1815  BP: 138/86 125/87 (!) 130/94   Pulse: (!) 107 (!) 103 89 95  Resp: 17 16 (!) 22 17  Temp: 98.3 F (36.8 C) 97.7 F (36.5 C) 98.3 F (36.8 C)   TempSrc: Oral Oral Oral   SpO2: 93% 97% 99% 95%  Weight:      Height:        General: Pt is alert, awake, not in acute distress at rest. Mild distress on mobility, distressed with intractable cough at times. Cardiovascular: RRR, S1/S2 +, no rubs, no gallops Respiratory: Bilateral clear.  Occasional upper airway sounds. Abdominal: Soft, NT, ND, bowel sounds + Extremities: no  edema, no cyanosis Left upper extremity AV fistula.    The results of significant diagnostics from this hospitalization (including imaging, microbiology, ancillary and laboratory) are listed below for reference.     Microbiology: Recent Results (from the past 240 hour(s))  Blood culture (routine x 2)     Status: None   Collection Time: 07/07/20  2:17 PM   Specimen: BLOOD  Result Value Ref Range Status   Specimen Description BLOOD RIGHT ANTECUBITAL  Final   Special Requests   Final    BOTTLES DRAWN AEROBIC AND ANAEROBIC Blood Culture results may not be optimal due to an excessive volume of blood received in culture bottles   Culture   Final    NO GROWTH 5 DAYS Performed at [facility], [place] [address], [place], [place] [address]    Report Status 07/12/2020 FINAL  Final  Resp Panel by RT-PCR (Flu A&B, Covid) Nasopharyngeal Swab     Status: None   Collection Time: 07/07/20  2:34 PM   Specimen:  Nasopharyngeal Swab; Nasopharyngeal(NP) swabs in vial transport medium  Result Value Ref Range Status   SARS Coronavirus 2 by RT PCR NEGATIVE NEGATIVE Final    Comment: (NOTE) SARS-CoV-2 target nucleic acids are NOT DETECTED.  The SARS-CoV-2 RNA is generally detectable in upper respiratory specimens during the acute phase of infection. The lowest concentration of SARS-CoV-2 viral copies this assay can detect is 138 copies/mL. A negative result does not preclude SARS-Cov-2 infection and should not be used as the sole basis for treatment or other patient management decisions. A negative result may occur with  improper specimen collection/handling, submission of specimen other than nasopharyngeal swab, presence of viral mutation(s) within the areas targeted by this assay, and inadequate number of viral copies(<138 copies/mL). A negative result must be combined with clinical observations, patient history, and epidemiological information. The expected result is Negative.  Fact Sheet for Patients:  EntrepreneurPulse.com.au  Fact Sheet for Healthcare Providers:  IncredibleEmployment.be  This test is no t yet approved or cleared by the [place] FDA and  has been authorized for detection and/or diagnosis of SARS-CoV-2 by FDA under an Emergency Use Authorization (EUA). This EUA will remain  in effect (meaning this test can be used) for the duration of the COVID-19 declaration under Section 564(b)(1) of the Act, 21 U.S.C.section 360bbb-3(b)(1), unless the authorization is terminated  or revoked sooner.       Influenza A by PCR NEGATIVE NEGATIVE Final   Influenza B by PCR NEGATIVE NEGATIVE Final    Comment: (NOTE) The Xpert Xpress SARS-CoV-2/FLU/RSV plus assay is intended as an aid in the diagnosis of influenza from Nasopharyngeal swab specimens and should not be used as a sole basis for treatment. Nasal washings and aspirates are unacceptable for  Xpert Xpress SARS-CoV-2/FLU/RSV testing.  Fact Sheet for Patients: EntrepreneurPulse.com.au  Fact Sheet for Healthcare Providers: IncredibleEmployment.be  This test is not yet approved or cleared by the [place] FDA and has been authorized for detection and/or diagnosis of SARS-CoV-2 by FDA under an Emergency Use Authorization (EUA). This EUA will remain in effect (meaning this test can be used) for the duration of the COVID-19 declaration under Section 564(b)(1) of the Act, 21 U.S.C. section 360bbb-3(b)(1), unless the authorization is terminated or revoked.  Performed at [facility], [place] [address] [place], [place] [address]   MRSA PCR Screening     Status: None   Collection Time: 07/07/20 10:12 PM   Specimen: Nasal Mucosa; Nasopharyngeal  Result Value Ref Range Status   MRSA by PCR NEGATIVE NEGATIVE Final    Comment:        The GeneXpert MRSA Assay (FDA approved for NASAL specimens only), is one component of a comprehensive MRSA colonization surveillance program. It is not intended to diagnose MRSA infection nor to guide or monitor treatment for MRSA infections. Performed at [facility], [place] [address] [place] [address], [place], [place] [address]   Fungus Culture With Stain     Status: None (Preliminary result)   Collection Time: 07/09/20 10:55 AM   Specimen: PATH Cytology washing; Body Fluid  Result Value Ref Range Status   Fungus Stain Final report  Final    Comment: (NOTE) Performed At: [facility] [place], [place] [id number] Rush Farmer MD OV:[id number]    Fungus (Mycology) Culture PENDING  Incomplete   Fungal Source BRONCHIAL ALVEOLAR LAVAGE  Final    Comment: RLL BAL SPEC A Performed at [facility], [place] [address] [place], [place] [address]   Culture, Respiratory w Gram Stain     Status: None   Collection Time: 07/09/20 10:55 AM   Specimen: PATH Cytology washing; Body Fluid  Result  Value Ref Range Status   Specimen Description BRONCHIAL ALVEOLAR LAVAGE  Final   Special Requests RLL BAL SPEC A  Final   Gram Stain NO WBC SEEN NO ORGANISMS SEEN   Final   Culture   Final    NO GROWTH Performed at [facility], [address] [place], [place] [address]    Report Status 07/11/2020 FINAL  Final  Acid Fast Smear (AFB)     Status: None   Collection Time: 07/09/20 10:55 AM   Specimen: PATH Cytology washing; Body Fluid  Result Value Ref Range Status   AFB Specimen Processing Concentration  Final   Acid Fast Smear Negative  Final    Comment: (NOTE) Performed At: [facility] [place], [place] [id number] Rush Farmer MD [id number]    Source (AFB) BRONCHIAL ALVEOLAR LAVAGE  Final    Comment: RLL BAL SPEC A Performed at [facility], [place] [address], [place], [place] [address]   Anaerobic culture w Gram Stain     Status: None   Collection Time: 07/09/20 10:55 AM   Specimen: Bronchoalveolar Lavage  Result Value Ref Range Status   Specimen Description BRONCHIAL ALVEOLAR LAVAGE  Final   Special Requests RLL SPEC A  Final   Gram Stain NO WBC SEEN NO ORGANISMS SEEN   Final   Culture   Final    NO ANAEROBES ISOLATED Performed at [facility], [place] [address], [place], [place] [address]    Report Status 07/14/2020 FINAL  Final  Fungus Culture Result     Status: None   Collection Time: 07/09/20 10:55 AM  Result Value Ref Range Status   Result 1 Comment  Final    Comment: (NOTE) KOH/Calcofluor preparation:  no fungus observed. Performed At: [facility] [place], [place] [id number] Rush Farmer MD QA:[id number]   Anaerobic culture w Gram Stain     Status: None   Collection Time: 07/09/20 10:58 AM   Specimen: Bronchoalveolar Lavage  Result Value Ref Range Status   Specimen Description BRONCHIAL ALVEOLAR LAVAGE  Final   Special Requests LLL SPEC C  Final   Gram Stain   Final    RARE WBC PRESENT,  PREDOMINANTLY MONONUCLEAR NO ORGANISMS SEEN    Culture  Final    NO ANAEROBES ISOLATED Performed at [facility], [place] [address] [place], [place] [address]    Report Status 07/14/2020 FINAL  Final  Acid Fast Smear (AFB)     Status: None   Collection Time: 07/09/20 10:59 AM   Specimen: PATH Cytology brushing; Body Fluid  Result Value Ref Range Status   AFB Specimen Processing Concentration  Final   Acid Fast Smear Negative  Final    Comment: (NOTE) Performed At: [facility] [place], [place] [id number] Rush Farmer MD QI:[id number]    Source (AFB) LLL BRUSHING [facility] D  Final    Comment: Performed at [facility], [place] [address], [place], [place] [address]  Anaerobic culture w Gram Stain     Status: None   Collection Time: 07/09/20 10:59 AM   Specimen: PATH Cytology brushing; Body Fluid  Result Value Ref Range Status   Specimen Description FLUID  Final   Special Requests LLL BRUSHING SPEC D  Final   Gram Stain NO WBC SEEN NO ORGANISMS SEEN   Final   Culture   Final    NO ANAEROBES ISOLATED Performed at [facility], [address] [place] [address], [place], [place] [address]    Report Status 07/14/2020 FINAL  Final  Culture, BAL-quantitative w Gram Stain     Status: None   Collection Time: 07/09/20 10:59 AM   Specimen: Bronchial Brush  Result Value Ref Range Status   Specimen Description BRONCHIAL BRUSHING  Final   Special Requests LLL BRUSHING SPEC D  Final   Gram Stain NO WBC SEEN NO ORGANISMS SEEN   Final   Culture   Final    NO GROWTH Performed at [facility], [place] [address] [place], [place] [address]    Report Status 07/11/2020 FINAL  Final  Culture, fungus without smear     Status: None (Preliminary result)   Collection Time: 07/09/20  1:04 PM   Specimen: PATH Cytology brushing; Body Fluid  Result Value Ref Range Status   Specimen Description FLUID  Final   Special Requests RLL BRUSHING SPEC B  Final   Culture   Final     NO FUNGUS ISOLATED AFTER 8 DAYS Performed at [facility], [address] [place] [address] [place], [place] [address]    Report Status PENDING  Incomplete  Acid Fast Smear (AFB)     Status: None   Collection Time: 07/09/20  1:04 PM   Specimen: PATH Cytology brushing; Body Fluid  Result Value Ref Range Status   AFB Specimen Processing Concentration  Final   Acid Fast Smear Negative  Final    Comment: (NOTE) Performed At: [facility] [place], [place] [id number] Rush Farmer MD OY:[id number]    Source (AFB) FLUID  Final    Comment: RLL BRUSHING SPEC B Performed at [facility], [place] [address], [place], [place] [address]   Anaerobic culture w Gram Stain     Status: None   Collection Time: 07/09/20  1:04 PM   Specimen: PATH Cytology brushing; Body Fluid  Result Value Ref Range Status   Specimen Description FLUID  Final   Special Requests RLL BRUSHING SPEC B  Final   Gram Stain NO WBC SEEN NO ORGANISMS SEEN   Final   Culture   Final    NO ANAEROBES ISOLATED Performed at [facility], [address] [place], [place] [address]    Report Status 07/14/2020 FINAL  Final  Culture, BAL-quantitative w Gram Stain     Status: None   Collection Time: 07/09/20  1:04 PM   Specimen: Bronchial Brush  Result Value Ref Range Status   Specimen Description BRONCHIAL BRUSHING  Final   Special Requests RLL SPEC B  Final   Gram Stain NO WBC SEEN NO ORGANISMS SEEN   Final   Culture   Final    NO GROWTH Performed at [facility], [address], [place], [place] [address]    Report Status 07/11/2020 FINAL  Final  Fungus Culture With Stain     Status: None (Preliminary result)   Collection Time: 07/09/20  1:31 PM   Specimen: PATH Cytology washing; Body Fluid  Result Value Ref Range Status   Fungus Stain Final report  Final    Comment: (NOTE) Performed At: [facility] [place], [place] [id number] Rush Farmer MD TD:[id number]     Fungus (Mycology) Culture PENDING  Incomplete   Fungal Source BRONCHIAL ALVEOLAR LAVAGE  Final    Comment: LLL SPEC C Performed at [facility], [place] [address] [place], [place] [address]   Culture, Respiratory w Gram Stain     Status: None   Collection Time: 07/09/20  1:31 PM   Specimen: PATH Cytology washing; Body Fluid  Result Value Ref Range Status   Specimen Description BRONCHIAL ALVEOLAR LAVAGE  Final   Special Requests LLL SPEC C  Final   Gram Stain   Final    RARE WBC PRESENT, PREDOMINANTLY MONONUCLEAR NO ORGANISMS SEEN    Culture   Final    NO GROWTH Performed at [facility], [address], [place], [place] [address]    Report Status 07/11/2020 FINAL  Final  Acid Fast Smear (AFB)     Status: None   Collection Time: 07/09/20  1:31 PM   Specimen: PATH Cytology washing; Body Fluid  Result Value Ref Range Status   AFB Specimen Processing Concentration  Final   Acid Fast Smear Negative  Final    Comment: (NOTE) Performed At: [facility] [place], [place] [id number] Rush Farmer MD WO:[id number]    Source (AFB) BRONCHIAL ALVEOLAR LAVAGE  Final    Comment: LLL SPEC C Performed at [facility], [place] [address], [place], [place] [address]   Fungus Culture Result     Status: None   Collection Time: 07/09/20  1:31 PM  Result Value Ref Range Status   Result 1 Comment  Final    Comment: (NOTE) KOH/Calcofluor preparation:  no fungus observed. Performed At: [facility] [place], [place] [id number] Rush Farmer MD HW:[id number]      Labs: BNP (last 3 results) No results for input(s): BNP in the last 8760 hours. Basic Metabolic Panel: Recent Labs  Lab 07/13/20 0236 07/13/20 0810 07/15/20 0945  NA  --  137 137  K  --  3.7 3.4*  CL  --  100 102  CO2  --  24 21*  GLUCOSE  --  93 87  BUN  --  35* 38*  CREATININE  --  8.96* 9.18*  CALCIUM  --  8.2* 8.6*  MG 2.2  --   --   PHOS 3.6 4.0 4.2    Liver Function Tests: Recent Labs  Lab 07/13/20 0810 07/15/20 0945  ALBUMIN 2.5* 2.4*   No results for input(s): LIPASE, AMYLASE in the last 168 hours. No results for input(s): AMMONIA in the  last 168 hours. CBC: Recent Labs  Lab 07/12/20 1112 07/13/20 0810 07/15/20 0945  WBC  --  10.1 8.9  HGB  --  8.3* 7.4*  HCT  --  25.1* 23.3*  MCV  --  90.0 90.7  PLT 468* 453* 420*   Cardiac Enzymes: No results for input(s): CKTOTAL, CKMB, CKMBINDEX, TROPONINI in the last 168 hours. BNP: Invalid input(s): POCBNP CBG: Recent Labs  Lab 07/10/20 1703 07/10/20 2134 07/11/20 0724  GLUCAP 135* 151* 124*   D-Dimer No results for input(s): DDIMER in the last 72 hours. Hgb A1c No results for input(s): HGBA1C in the last 72 hours. Lipid Profile No results for input(s): CHOL, HDL, LDLCALC, TRIG, CHOLHDL, LDLDIRECT in the last 72 hours. Thyroid function studies No results for input(s): TSH, T4TOTAL, T3FREE, THYROIDAB in the last 72 hours.  Invalid input(s): FREET3 Anemia work up No results for input(s): VITAMINB12, FOLATE, FERRITIN, TIBC, IRON, RETICCTPCT in the last 72 hours. Urinalysis    Component Value Date/Time   COLORURINE YELLOW 07/03/2020 1002   APPEARANCEUR CLEAR 07/03/2020 1002   LABSPEC 1.010 07/03/2020 1002   PHURINE 9.0 (H) 07/03/2020 1002   GLUCOSEU 50 (A) 07/03/2020 1002   HGBUR MODERATE (A) 07/03/2020 1002   BILIRUBINUR NEGATIVE 07/03/2020 1002   KETONESUR 20 (A) 07/03/2020 1002   PROTEINUR 100 (A) 07/03/2020 1002   NITRITE NEGATIVE 07/03/2020 1002   LEUKOCYTESUR NEGATIVE 07/03/2020 1002   Sepsis Labs Invalid input(s): PROCALCITONIN,  WBC,  LACTICIDVEN Microbiology Recent Results (from the past 240 hour(s))  Blood culture (routine x 2)     Status: None   Collection Time: 07/07/20  2:17 PM   Specimen: BLOOD  Result Value Ref Range Status   Specimen Description BLOOD RIGHT ANTECUBITAL  Final   Special Requests   Final    BOTTLES DRAWN AEROBIC AND  ANAEROBIC Blood Culture results may not be optimal due to an excessive volume of blood received in culture bottles   Culture   Final    NO GROWTH 5 DAYS Performed at [facility], [place] [address] [place], [place] [address]    Report Status 07/12/2020 FINAL  Final  Resp Panel by RT-PCR (Flu A&B, Covid) Nasopharyngeal Swab     Status: None   Collection Time: 07/07/20  2:34 PM   Specimen: Nasopharyngeal Swab; Nasopharyngeal(NP) swabs in vial transport medium  Result Value Ref Range Status   SARS Coronavirus 2 by RT PCR NEGATIVE NEGATIVE Final    Comment: (NOTE) SARS-CoV-2 target nucleic acids are NOT DETECTED.  The SARS-CoV-2 RNA is generally detectable in upper respiratory specimens during the acute phase of infection. The lowest concentration of SARS-CoV-2 viral copies this assay can detect is 138 copies/mL. A negative result does not preclude SARS-Cov-2 infection and should not be used as the sole basis for treatment or other patient management decisions. A negative result may occur with  improper specimen collection/handling, submission of specimen other than nasopharyngeal swab, presence of viral mutation(s) within the areas targeted by this assay, and inadequate number of viral copies(<138 copies/mL). A negative result must be combined with clinical observations, patient history, and epidemiological information. The expected result is Negative.  Fact Sheet for Patients:  EntrepreneurPulse.com.au  Fact Sheet for Healthcare Providers:  IncredibleEmployment.be  This test is no t yet approved or cleared by the [place] FDA and  has been authorized for detection and/or diagnosis of SARS-CoV-2 by FDA under an Emergency Use Authorization (EUA). This EUA will remain  in effect (meaning this  test can be used) for the duration of the COVID-19 declaration under Section 564(b)(1) of the Act, 21 U.S.C.section 360bbb-3(b)(1), unless the  authorization is terminated  or revoked sooner.       Influenza A by PCR NEGATIVE NEGATIVE Final   Influenza B by PCR NEGATIVE NEGATIVE Final    Comment: (NOTE) The Xpert Xpress SARS-CoV-2/FLU/RSV plus assay is intended as an aid in the diagnosis of influenza from Nasopharyngeal swab specimens and should not be used as a sole basis for treatment. Nasal washings and aspirates are unacceptable for Xpert Xpress SARS-CoV-2/FLU/RSV testing.  Fact Sheet for Patients: EntrepreneurPulse.com.au  Fact Sheet for Healthcare Providers: IncredibleEmployment.be  This test is not yet approved or cleared by the [place] FDA and has been authorized for detection and/or diagnosis of SARS-CoV-2 by FDA under an Emergency Use Authorization (EUA). This EUA will remain in effect (meaning this test can be used) for the duration of the COVID-19 declaration under Section 564(b)(1) of the Act, 21 U.S.C. section 360bbb-3(b)(1), unless the authorization is terminated or revoked.  Performed at [facility], [place] [address], [place], [place] [address]   MRSA PCR Screening     Status: None   Collection Time: 07/07/20 10:12 PM   Specimen: Nasal Mucosa; Nasopharyngeal  Result Value Ref Range Status   MRSA by PCR NEGATIVE NEGATIVE Final    Comment:        The GeneXpert MRSA Assay (FDA approved for NASAL specimens only), is one component of a comprehensive MRSA colonization surveillance program. It is not intended to diagnose MRSA infection nor to guide or monitor treatment for MRSA infections. Performed at [facility], [place] [address] [place], [place] [address]   Fungus Culture With Stain     Status: None (Preliminary result)   Collection Time: 07/09/20 10:55 AM   Specimen: PATH Cytology washing; Body Fluid  Result Value Ref Range Status   Fungus Stain Final report  Final    Comment: (NOTE) Performed At: [facility] [place], [place] [id number] Rush Farmer MD NU:[id number]    Fungus (Mycology) Culture PENDING  Incomplete   Fungal Source BRONCHIAL ALVEOLAR LAVAGE  Final    Comment: RLL BAL SPEC A Performed at [facility], [place] [address] [place], [place] [address]   Culture, Respiratory w Gram Stain     Status: None   Collection Time: 07/09/20 10:55 AM   Specimen: PATH Cytology washing; Body Fluid  Result Value Ref Range Status   Specimen Description BRONCHIAL ALVEOLAR LAVAGE  Final   Special Requests RLL BAL SPEC A  Final   Gram Stain NO WBC SEEN NO ORGANISMS SEEN   Final   Culture   Final    NO GROWTH Performed at [facility], [address] [place], [place] [address]    Report Status 07/11/2020 FINAL  Final  Acid Fast Smear (AFB)     Status: None   Collection Time: 07/09/20 10:55 AM   Specimen: PATH Cytology washing; Body Fluid  Result Value Ref Range Status   AFB Specimen Processing Concentration  Final   Acid Fast Smear Negative  Final    Comment: (NOTE) Performed At: [facility] [place], [place] [id number] Rush Farmer MD [id number]    Source (AFB) BRONCHIAL ALVEOLAR LAVAGE  Final    Comment: RLL BAL SPEC A Performed at [facility], [place] [address] [place], [place] [address]   Anaerobic culture w Gram  Stain     Status: None   Collection Time: 07/09/20 10:55 AM   Specimen: Bronchoalveolar Lavage  Result Value Ref Range Status   Specimen Description BRONCHIAL ALVEOLAR LAVAGE  Final   Special Requests RLL SPEC A  Final   Gram Stain NO WBC SEEN NO ORGANISMS SEEN   Final   Culture   Final    NO ANAEROBES ISOLATED Performed at [facility], [place] [address] [place], [place] [address]    Report Status 07/14/2020 FINAL  Final  Fungus Culture Result     Status: None   Collection Time: 07/09/20 10:55 AM  Result Value Ref Range Status   Result 1 Comment  Final    Comment: (NOTE) KOH/Calcofluor preparation:  no fungus  observed. Performed At: [facility] [place], [place] [id number] Rush Farmer MD JM:[id number]   Anaerobic culture w Gram Stain     Status: None   Collection Time: 07/09/20 10:58 AM   Specimen: Bronchoalveolar Lavage  Result Value Ref Range Status   Specimen Description BRONCHIAL ALVEOLAR LAVAGE  Final   Special Requests LLL SPEC C  Final   Gram Stain   Final    RARE WBC PRESENT, PREDOMINANTLY MONONUCLEAR NO ORGANISMS SEEN    Culture   Final    NO ANAEROBES ISOLATED Performed at [facility], [place] [address], [place], [place] [address]    Report Status 07/14/2020 FINAL  Final  Acid Fast Smear (AFB)     Status: None   Collection Time: 07/09/20 10:59 AM   Specimen: PATH Cytology brushing; Body Fluid  Result Value Ref Range Status   AFB Specimen Processing Concentration  Final   Acid Fast Smear Negative  Final    Comment: (NOTE) Performed At: [facility] [place], [place] [id number] Rush Farmer MD EY:[id number]    Source (AFB) LLL BRUSHING [facility] D  Final    Comment: Performed at [facility], [place] [address], [place], [place] [address]  Anaerobic culture w Gram Stain     Status: None   Collection Time: 07/09/20 10:59 AM   Specimen: PATH Cytology brushing; Body Fluid  Result Value Ref Range Status   Specimen Description FLUID  Final   Special Requests LLL BRUSHING SPEC D  Final   Gram Stain NO WBC SEEN NO ORGANISMS SEEN   Final   Culture   Final    NO ANAEROBES ISOLATED Performed at [facility], [address] [place], [place] [address]    Report Status 07/14/2020 FINAL  Final  Culture, BAL-quantitative w Gram Stain     Status: None   Collection Time: 07/09/20 10:59 AM   Specimen: Bronchial Brush  Result Value Ref Range Status   Specimen Description BRONCHIAL BRUSHING  Final   Special Requests LLL BRUSHING SPEC D  Final   Gram Stain NO WBC SEEN NO ORGANISMS SEEN   Final   Culture   Final    NO  GROWTH Performed at [facility], [place] [address] [place], [place] [address]    Report Status 07/11/2020 FINAL  Final  Culture, fungus without smear     Status: None (Preliminary result)   Collection Time: 07/09/20  1:04 PM   Specimen: PATH Cytology brushing; Body Fluid  Result Value Ref Range Status   Specimen Description FLUID  Final   Special Requests RLL BRUSHING SPEC B  Final   Culture   Final    NO FUNGUS  ISOLATED AFTER 8 DAYS Performed at [facility], [place] [address], [place], [place] [address]    Report Status PENDING  Incomplete  Acid Fast Smear (AFB)     Status: None   Collection Time: 07/09/20  1:04 PM   Specimen: PATH Cytology brushing; Body Fluid  Result Value Ref Range Status   AFB Specimen Processing Concentration  Final   Acid Fast Smear Negative  Final    Comment: (NOTE) Performed At: [facility] [place], [place] [id number] Rush Farmer MD QB:[id number]    Source (AFB) FLUID  Final    Comment: RLL BRUSHING SPEC B Performed at [facility], [place] [address] [place], [place] [address]   Anaerobic culture w Gram Stain     Status: None   Collection Time: 07/09/20  1:04 PM   Specimen: PATH Cytology brushing; Body Fluid  Result Value Ref Range Status   Specimen Description FLUID  Final   Special Requests RLL BRUSHING SPEC B  Final   Gram Stain NO WBC SEEN NO ORGANISMS SEEN   Final   Culture   Final    NO ANAEROBES ISOLATED Performed at [facility], [address] [place], [place] [address]    Report Status 07/14/2020 FINAL  Final  Culture, BAL-quantitative w Gram Stain     Status: None   Collection Time: 07/09/20  1:04 PM   Specimen: Bronchial Brush  Result Value Ref Range Status   Specimen Description BRONCHIAL BRUSHING  Final   Special Requests RLL SPEC B  Final   Gram Stain NO WBC SEEN NO ORGANISMS SEEN   Final   Culture   Final    NO GROWTH Performed at [facility], [place] [address]  [place], [place] [address]    Report Status 07/11/2020 FINAL  Final  Fungus Culture With Stain     Status: None (Preliminary result)   Collection Time: 07/09/20  1:31 PM   Specimen: PATH Cytology washing; Body Fluid  Result Value Ref Range Status   Fungus Stain Final report  Final    Comment: (NOTE) Performed At: [facility] [place], [place] [id number] Rush Farmer MD VV:[id number]    Fungus (Mycology) Culture PENDING  Incomplete   Fungal Source BRONCHIAL ALVEOLAR LAVAGE  Final    Comment: LLL SPEC C Performed at [facility], [place] [address] [place], [place] [address]   Culture, Respiratory w Gram Stain     Status: None   Collection Time: 07/09/20  1:31 PM   Specimen: PATH Cytology washing; Body Fluid  Result Value Ref Range Status   Specimen Description BRONCHIAL ALVEOLAR LAVAGE  Final   Special Requests LLL SPEC C  Final   Gram Stain   Final    RARE WBC PRESENT, PREDOMINANTLY MONONUCLEAR NO ORGANISMS SEEN    Culture   Final    NO GROWTH Performed at [facility], [address] [place], [place] [address]    Report Status 07/11/2020 FINAL  Final  Acid Fast Smear (AFB)     Status: None   Collection Time: 07/09/20  1:31 PM   Specimen: PATH Cytology washing; Body Fluid  Result Value Ref Range Status   AFB Specimen Processing Concentration  Final   Acid Fast Smear Negative  Final    Comment: (NOTE) Performed At: [facility] [place], [place] [id number] Rush Farmer MD GP:[id number]    Source (AFB) BRONCHIAL ALVEOLAR LAVAGE  Final    Comment: LLL SPEC C Performed at [facility], [place] [address] [place], [place] [address]   Fungus Culture Result     Status: None   Collection Time: 07/09/20  1:31 PM  Result Value Ref Range Status   Result 1 Comment  Final    Comment: (NOTE) KOH/Calcofluor preparation:  no fungus observed. Performed At: [facility] [place], [place]  [id number] Rush Farmer MD BF:[id number]      Time coordinating discharge:  35 minutes  SIGNED:   Barb Merino, MD  [facility] 07/17/2020, 11:42 AM

## 2020-07-16 NOTE — Progress Notes (Signed)
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  In the emergency room, she was 75% on room air.  CT chest showed multifocal pneumonia.  Patient was admitted to hospital with pulmonary consultation.  She underwent bronchoscopy and bronchoalveolar lavage.  Remained in the hospital with high oxygen requirement and need for IV antibiotics. Completing 10 days of antibiotics today.   3/19, has some dry cough but overall improving.  Required significant oxygen on mobility. Repeat x-ray with improved aeration.  Patient symptomatically feeling better. Hemoglobin 7.4, 1 unit of PRBC Patient wants to go home, will arrange for ambulatory oxygen, 6 L on mobility with very gradual mobility.   Assessment & Plan:   Active Problems:   Multifocal pneumonia  Multifocal pneumonia in immunocompromised , acute hypoxemic respiratory failure:  Patient was treated with vancomycin and cefepime.  Currently remains on cefepime day 10. Chest physiotherapy, incentive spirometry, deep breathing exercises, sputum induction, mucolytic's and bronchodilators. All cultures are negative so far. Supplemental oxygen to keep saturations more than 90%.  Oxygenation is improving. Patient is seen by PCCM and underwent bronchoscopy on 3/12, no positive growth so far. Completing 10 days of antibiotics today.  Will discharge home after completing antibiotics. Mobilized with oxygen, needing more than 6 L of oxygen on mobility. Repeat chest x-ray 3/19, improvement of overall aeration.  Discussed with  pulmonary to see in follow-up.  ESRD on hemodialysis: Monday Wednesday Friday dialysis.  Followed by nephrology.  Renal transplant: Patient on tacrolimus and low-dose prednisone that is continued.  Hypertension: Blood pressure stable on amlodipine and losartan.  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  Continue.  Anemia of chronic disease: Hemoglobin significantly lower than baseline.  7.4.  With symptomatic dyspnea, will benefit with 1 unit of PRBC.  Discussed with nephrology.  Patient consented.  Will transfuse 1 unit of PRBC before discharge.  No indication for recheck.  Volume status optimized.  Hemodialysis today.  Continue mobilizing and weaning off oxygen.  If able to wean off oxygen and walk, go home on oral antibiotics.   DVT prophylaxis: heparin injection 5,000 Units Start: 07/07/20 2200   Code Status: Full code Family Communication: None today. Disposition Plan: Status is: Inpatient  Remains inpatient appropriate because:IV treatments appropriate due to intensity of illness or inability to take PO and Inpatient level of care appropriate due to severity of illness   Dispo: The patient is from: Home              Anticipated d/c is to: Home              Patient currently is not medically stable to d/c.   Difficult to place patient No         Consultants:   PCCM  Nephrology  Procedures:   None  Antimicrobials:   Vancomycin, stopped.  cefepime 3/10---   Subjective:  Patient seen and examined.  No overnight events.  Was on 1 L oxygen when completely resting.  Dry cough present.  Wife at the bedside.  Extensive discussion, results discussed and updated.  She wanted to go home.  Feels comfortable, however  on mobility she needed more oxygen.  Was difficult to maintain on 6 L of oxygen.  Objective: Vitals:   07/16/20 0516 07/16/20 0753 07/16/20 0840 07/16/20 1244  BP: 136/89  (!) 156/104   Pulse: 100  (!) 108   Resp: 20  20   Temp: 98.3 F (36.8 C)  98.3  F (36.8 C)   TempSrc: Oral  Oral   SpO2: 95% 97% 95% 97%  Weight:      Height:       No intake or output data in the 24 hours ending 07/16/20 1356 Filed Weights   07/13/20 1105 07/15/20 0935 07/15/20 1235  Weight: 73.5 kg 72.7 kg 72 kg    Examination: General: Looks comfortable at rest.  Easily tired and exhausted on mobility. SpO2: 97 % O2 Flow Rate (L/min): 1 L/min FiO2 (%): 40 % Cardiovascular: S1-S2 normal. Respiratory: Bilateral clear.  Currently on 1 to 2 L oxygen. Gastrointestinal: Soft and nontender.  Bowel sounds present. Ext: No edema or cyanosis.  Left upper extremity AV fistula present.     Data Reviewed: I have personally reviewed following labs and imaging studies  CBC: Recent Labs  Lab 07/12/20 1112 07/13/20 0810 07/15/20 0945  WBC  --  10.1 8.9  HGB  --  8.3* 7.4*  HCT  --  25.1* 23.3*  MCV  --  90.0 90.7  PLT 468* 453* 295*   Basic Metabolic Panel: Recent Labs  Lab 07/13/20 0236 07/13/20 0810 07/15/20 0945  NA  --  137 137  K  --  3.7 3.4*  CL  --  100 102  CO2  --  24 21*  GLUCOSE  --  93 87  BUN  --  35* 38*  CREATININE  --  8.96* 9.18*  CALCIUM  --  8.2* 8.6*  MG 2.2  --   --   PHOS 3.6 4.0 4.2   GFR: Estimated Creatinine Clearance: 8 mL/min (A) (by C-G formula based on SCr of 9.18 mg/dL (H)). Liver Function Tests: Recent Labs  Lab 07/13/20 0810 07/15/20 0945  ALBUMIN 2.5* 2.4*   No results for input(s): LIPASE, AMYLASE in the last 168 hours. No results for input(s): AMMONIA in the last 168 hours. Coagulation Profile: No results for input(s): INR, PROTIME in the last 168 hours. Cardiac Enzymes: No results for input(s): CKTOTAL, CKMB, CKMBINDEX, TROPONINI in the last 168 hours. BNP (last 3 results) No results for input(s): PROBNP in the last 8760 hours. HbA1C: No results for input(s): HGBA1C in the last 72 hours. CBG: Recent Labs  Lab 07/10/20 0806 07/10/20 1133 07/10/20 1703 07/10/20 2134 07/11/20 0724  GLUCAP  135* 164* 135* 151* 124*   Lipid Profile: No results for input(s): CHOL, HDL, LDLCALC, TRIG, CHOLHDL, LDLDIRECT in the last 72 hours. Thyroid Function Tests: No results for input(s): TSH, T4TOTAL, FREET4, T3FREE, THYROIDAB in the last 72 hours. Anemia Panel: No results for input(s): VITAMINB12, FOLATE, FERRITIN, TIBC, IRON, RETICCTPCT in the last 72 hours. Sepsis Labs: No results for input(s): PROCALCITON, LATICACIDVEN in the last 168 hours.  Recent Results (from the past 240 hour(s))  Blood culture (routine x 2)     Status: None   Collection Time: 07/07/20  2:17 PM   Specimen: BLOOD  Result Value Ref Range Status   Specimen Description BLOOD RIGHT ANTECUBITAL  Final   Special Requests   Final    BOTTLES DRAWN AEROBIC AND ANAEROBIC Blood Culture results may not be optimal due to an excessive volume of  blood received in culture bottles   Culture   Final    NO GROWTH 5 DAYS Performed at [facility], [place] [address], [place], [place] [address]    Report Status 07/12/2020 FINAL  Final  Resp Panel by RT-PCR (Flu A&B, Covid) Nasopharyngeal Swab     Status: None   Collection Time: 07/07/20  2:34 PM   Specimen: Nasopharyngeal Swab; Nasopharyngeal(NP) swabs in vial transport medium  Result Value Ref Range Status   SARS Coronavirus 2 by RT PCR NEGATIVE NEGATIVE Final    Comment: (NOTE) SARS-CoV-2 target nucleic acids are NOT DETECTED.  The SARS-CoV-2 RNA is generally detectable in upper respiratory specimens during the acute phase of infection. The lowest concentration of SARS-CoV-2 viral copies this assay can detect is 138 copies/mL. A negative result does not preclude SARS-Cov-2 infection and should not be used as the sole basis for treatment or other patient management decisions. A negative result may occur with  improper specimen collection/handling, submission of specimen other than nasopharyngeal swab, presence of viral mutation(s) within the areas targeted by this assay,  and inadequate number of viral copies(<138 copies/mL). A negative result must be combined with clinical observations, patient history, and epidemiological information. The expected result is Negative.  Fact Sheet for Patients:  EntrepreneurPulse.com.au  Fact Sheet for Healthcare Providers:  IncredibleEmployment.be  This test is no t yet approved or cleared by the [place] FDA and  has been authorized for detection and/or diagnosis of SARS-CoV-2 by FDA under an Emergency Use Authorization (EUA). This EUA will remain  in effect (meaning this test can be used) for the duration of the COVID-19 declaration under Section 564(b)(1) of the Act, 21 U.S.C.section 360bbb-3(b)(1), unless the authorization is terminated  or revoked sooner.       Influenza A by PCR NEGATIVE NEGATIVE Final   Influenza B by PCR NEGATIVE NEGATIVE Final    Comment: (NOTE) The Xpert Xpress SARS-CoV-2/FLU/RSV plus assay is intended as an aid in the diagnosis of influenza from Nasopharyngeal swab specimens and should not be used as a sole basis for treatment. Nasal washings and aspirates are unacceptable for Xpert Xpress SARS-CoV-2/FLU/RSV testing.  Fact Sheet for Patients: EntrepreneurPulse.com.au  Fact Sheet for Healthcare Providers: IncredibleEmployment.be  This test is not yet approved or cleared by the [place] FDA and has been authorized for detection and/or diagnosis of SARS-CoV-2 by FDA under an Emergency Use Authorization (EUA). This EUA will remain in effect (meaning this test can be used) for the duration of the COVID-19 declaration under Section 564(b)(1) of the Act, 21 U.S.C. section 360bbb-3(b)(1), unless the authorization is terminated or revoked.  Performed at [facility], [place] [address] [place], [place] [address]   MRSA PCR Screening     Status: None   Collection Time: 07/07/20 10:12 PM   Specimen:  Nasal Mucosa; Nasopharyngeal  Result Value Ref Range Status   MRSA by PCR NEGATIVE NEGATIVE Final    Comment:        The GeneXpert MRSA Assay (FDA approved for NASAL specimens only), is one component of a comprehensive MRSA colonization surveillance program. It is not intended to diagnose MRSA infection nor to guide or monitor treatment for MRSA infections. Performed at [facility], [place] [address], [place], [place] [address]   Fungus Culture With Stain     Status: None (Preliminary result)   Collection Time: 07/09/20 10:55 AM   Specimen: PATH Cytology washing; Body Fluid  Result Value Ref Range Status  Fungus Stain Final report  Final    Comment: (NOTE) Performed At: [facility] [place], [place] [id number] Rush Farmer MD [id number]    Fungus (Mycology) Culture PENDING  Incomplete   Fungal Source BRONCHIAL ALVEOLAR LAVAGE  Final    Comment: RLL BAL SPEC A Performed at [facility], [place] [address] [place], [place] [address]   Culture, Respiratory w Gram Stain     Status: None   Collection Time: 07/09/20 10:55 AM   Specimen: PATH Cytology washing; Body Fluid  Result Value Ref Range Status   Specimen Description BRONCHIAL ALVEOLAR LAVAGE  Final   Special Requests RLL BAL SPEC A  Final   Gram Stain NO WBC SEEN NO ORGANISMS SEEN   Final   Culture   Final    NO GROWTH Performed at [facility], [address], [place], [place] [address]    Report Status 07/11/2020 FINAL  Final  Acid Fast Smear (AFB)     Status: None   Collection Time: 07/09/20 10:55 AM   Specimen: PATH Cytology washing; Body Fluid  Result Value Ref Range Status   AFB Specimen Processing Concentration  Final   Acid Fast Smear Negative  Final    Comment: (NOTE) Performed At: [facility] [place], [place] [id number] Rush Farmer MD FT:[id number]    Source (AFB) BRONCHIAL ALVEOLAR LAVAGE  Final    Comment: RLL BAL SPEC  A Performed at [facility], [place] [address] [place], [place] [address]   Anaerobic culture w Gram Stain     Status: None   Collection Time: 07/09/20 10:55 AM   Specimen: Bronchoalveolar Lavage  Result Value Ref Range Status   Specimen Description BRONCHIAL ALVEOLAR LAVAGE  Final   Special Requests RLL SPEC A  Final   Gram Stain NO WBC SEEN NO ORGANISMS SEEN   Final   Culture   Final    NO ANAEROBES ISOLATED Performed at [facility], [place] [address], [place], [place] [address]    Report Status 07/14/2020 FINAL  Final  Fungus Culture Result     Status: None   Collection Time: 07/09/20 10:55 AM  Result Value Ref Range Status   Result 1 Comment  Final    Comment: (NOTE) KOH/Calcofluor preparation:  no fungus observed. Performed At: [facility] [place], [place] [id number] Rush Farmer MD XT:[id number]   Anaerobic culture w Gram Stain     Status: None   Collection Time: 07/09/20 10:58 AM   Specimen: Bronchoalveolar Lavage  Result Value Ref Range Status   Specimen Description BRONCHIAL ALVEOLAR LAVAGE  Final   Special Requests LLL SPEC C  Final   Gram Stain   Final    RARE WBC PRESENT, PREDOMINANTLY MONONUCLEAR NO ORGANISMS SEEN    Culture   Final    NO ANAEROBES ISOLATED Performed at [facility], [place] [address] [place], [place] [address]    Report Status 07/14/2020 FINAL  Final  Acid Fast Smear (AFB)     Status: None   Collection Time: 07/09/20 10:59 AM   Specimen: PATH Cytology brushing; Body Fluid  Result Value Ref Range Status   AFB Specimen Processing Concentration  Final   Acid Fast Smear Negative  Final    Comment: (NOTE) Performed At: [facility] [place], [place] [id number] Rush Farmer MD HB:[id number]    Source (AFB) LLL BRUSHING [facility] D  Final  Comment: Performed at [facility], [place] [address] [place], [place] [address]  Anaerobic culture w Gram Stain     Status: None    Collection Time: 07/09/20 10:59 AM   Specimen: PATH Cytology brushing; Body Fluid  Result Value Ref Range Status   Specimen Description FLUID  Final   Special Requests LLL BRUSHING SPEC D  Final   Gram Stain NO WBC SEEN NO ORGANISMS SEEN   Final   Culture   Final    NO ANAEROBES ISOLATED Performed at [facility], [address], [place], [place] [address]    Report Status 07/14/2020 FINAL  Final  Culture, BAL-quantitative w Gram Stain     Status: None   Collection Time: 07/09/20 10:59 AM   Specimen: Bronchial Brush  Result Value Ref Range Status   Specimen Description BRONCHIAL BRUSHING  Final   Special Requests LLL BRUSHING SPEC D  Final   Gram Stain NO WBC SEEN NO ORGANISMS SEEN   Final   Culture   Final    NO GROWTH Performed at [facility], [place] [address], [place], [place] [address]    Report Status 07/11/2020 FINAL  Final  Culture, fungus without smear     Status: None (Preliminary result)   Collection Time: 07/09/20  1:04 PM   Specimen: PATH Cytology brushing; Body Fluid  Result Value Ref Range Status   Specimen Description FLUID  Final   Special Requests RLL BRUSHING SPEC B  Final   Culture   Final    NO FUNGUS ISOLATED AFTER 6 DAYS Performed at [facility], [address], [place], [place] [address]    Report Status PENDING  Incomplete  Acid Fast Smear (AFB)     Status: None   Collection Time: 07/09/20  1:04 PM   Specimen: PATH Cytology brushing; Body Fluid  Result Value Ref Range Status   AFB Specimen Processing Concentration  Final   Acid Fast Smear Negative  Final    Comment: (NOTE) Performed At: [facility] [place], [place] [id number] Rush Farmer MD AT:[id number]    Source (AFB) FLUID  Final    Comment: RLL BRUSHING SPEC B Performed at [facility], [place] [address], [place], [place] [address]   Anaerobic culture w Gram Stain     Status: None   Collection Time: 07/09/20  1:04 PM   Specimen: PATH  Cytology brushing; Body Fluid  Result Value Ref Range Status   Specimen Description FLUID  Final   Special Requests RLL BRUSHING SPEC B  Final   Gram Stain NO WBC SEEN NO ORGANISMS SEEN   Final   Culture   Final    NO ANAEROBES ISOLATED Performed at [facility], [address], [place], [place] [address]    Report Status 07/14/2020 FINAL  Final  Culture, BAL-quantitative w Gram Stain     Status: None   Collection Time: 07/09/20  1:04 PM   Specimen: Bronchial Brush  Result Value Ref Range Status   Specimen Description BRONCHIAL BRUSHING  Final   Special Requests RLL SPEC B  Final   Gram Stain NO WBC SEEN NO ORGANISMS SEEN   Final   Culture   Final    NO GROWTH Performed at [facility], [place] [address] [place], [place] [address]    Report Status 07/11/2020 FINAL  Final  Fungus Culture With Stain     Status: None (Preliminary result)  Collection Time: 07/09/20  1:31 PM   Specimen: PATH Cytology washing; Body Fluid  Result Value Ref Range Status   Fungus Stain Final report  Final    Comment: (NOTE) Performed At: [facility] [place], [place] [id number] Rush Farmer MD UD:[id number]    Fungus (Mycology) Culture PENDING  Incomplete   Fungal Source BRONCHIAL ALVEOLAR LAVAGE  Final    Comment: LLL SPEC C Performed at [facility], [place] [address], [place], [place] [address]   Culture, Respiratory w Gram Stain     Status: None   Collection Time: 07/09/20  1:31 PM   Specimen: PATH Cytology washing; Body Fluid  Result Value Ref Range Status   Specimen Description BRONCHIAL ALVEOLAR LAVAGE  Final   Special Requests LLL SPEC C  Final   Gram Stain   Final    RARE WBC PRESENT, PREDOMINANTLY MONONUCLEAR NO ORGANISMS SEEN    Culture   Final    NO GROWTH Performed at [facility], [address] [place], [place] [address]    Report Status 07/11/2020 FINAL  Final  Acid Fast Smear (AFB)     Status: None   Collection Time:  07/09/20  1:31 PM   Specimen: PATH Cytology washing; Body Fluid  Result Value Ref Range Status   AFB Specimen Processing Concentration  Final   Acid Fast Smear Negative  Final    Comment: (NOTE) Performed At: [facility] [place], [place] [id number] Rush Farmer MD [id number]    Source (AFB) BRONCHIAL ALVEOLAR LAVAGE  Final    Comment: LLL SPEC C Performed at [facility], [place] [address] [place], [place] [address]   Fungus Culture Result     Status: None   Collection Time: 07/09/20  1:31 PM  Result Value Ref Range Status   Result 1 Comment  Final    Comment: (NOTE) KOH/Calcofluor preparation:  no fungus observed. Performed At: [facility] [place], [place] [id number] Rush Farmer MD FK:[id number]          Radiology Studies: DG CHEST PORT 1 VIEW  Result Date: 07/16/2020 CLINICAL DATA:  Acute respiratory failure. EXAM: PORTABLE CHEST 1 VIEW COMPARISON:  Single-view of the chest 07/09/2020 and 07/13/2020. FINDINGS: There is patchy bilateral airspace disease which is markedly improved compared to the most recent exam. Small right pleural effusion is noted. No pneumothorax. Heart size is normal. IMPRESSION: Marked improvement in bilateral airspace disease. Small right pleural effusion noted. Electronically Signed   By: Inge Rise M.D.   On: 07/16/2020 12:22        Scheduled Meds:  sodium chloride   Intravenous Once   acidophilus  1 capsule Oral Daily   acyclovir  400 mg Oral Daily   amLODipine  2.5 mg Oral Daily   benzonatate  200 mg Oral BID   buPROPion  100 mg Oral Daily   Chlorhexidine Gluconate Cloth  6 each Topical Q0600   [START ON 07/18/2020] darbepoetin (ARANESP) injection - DIALYSIS  60 mcg Intravenous Q Mon-HD   escitalopram  20 mg Oral Daily   feeding supplement  1 Container Oral TID BM   fluticasone  1 spray Each Nare Daily   guaiFENesin  1,200 mg Oral BID   heparin  5,000 Units  Subcutaneous Q12H   ipratropium-albuterol  3 mL Nebulization QID   losartan  50 mg Oral Daily   mouth rinse  15 mL Mouth Rinse BID   melatonin  5 mg Oral QHS   multivitamin  1 tablet Oral QHS   predniSONE  5 mg Oral Q breakfast   senna-docusate  1 tablet Oral Daily   sevelamer carbonate  800 mg Oral TID WC   simvastatin  10 mg Oral q1800   Sirolimus  2 mg Oral Daily   Continuous Infusions:  ceFEPime (MAXIPIME) IV 1 g (07/15/20 2141)     LOS: 9 days    Time spent: 25 minutes    Barb Merino, MD [facility] Pager [phone]

## 2020-07-16 NOTE — Telephone Encounter (Signed)
Patient hospitalized with pneumonia and hypoxic respiratory failure.  She also has concern for sleep apnea.  She will need hospital follow visit in 2 weeks after discharge.  Please call her on Monday, 07/18/20, to schedule her for follow up with me or NP.

## 2020-07-16 NOTE — TOC Transition Note (Signed)
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  In the emergency room, she was 75% on room air.  CT chest showed multifocal pneumonia.  Patient was admitted to hospital with pulmonary consultation.  She underwent bronchoscopy and bronchoalveolar lavage.  Remained in the hospital with high oxygen requirement and need for IV antibiotics.  Pt for planned discharge home today with husband; she will require home oxygen due to hypoxia with ambulation.  Patient agreeable to home oxygen set up; referral to [place] for portable and home oxygen. Portable concentrator to be delivered to patient's room prior to dc, and then [place] will make arrangements to deliver permanent concentrator to home.      Final next level of care: Home/Self Care Barriers to Discharge: Barriers Resolved   Patient Goals and CMS Choice Patient states their goals for this hospitalization and ongoing recovery are:: to go home                            Discharge Plan and Services   Discharge Planning Services: CM Consult            DME Arranged: Oxygen DME Agency: [employer] Date DME Agency Contacted: 07/16/20 Time DME Agency Contacted: 1500 Representative spoke with at DME Agency: [place] Interventions     Readmission Risk Interventions No flowsheet data found.  Reinaldo Raddle, RN, BSN   Trauma/Neuro ICU Case Manager [phone]

## 2020-07-16 NOTE — Progress Notes (Signed)
[facility] NEPHROLOGY PROGRESS NOTE  Assessment/ Plan:  # Multifocal pneumonia with acute hypoxic respiratory failure: On broad-spectrum antimicrobial therapy with cefepime after previously being on Vanc.  Decreasing oxygen requirements noted via nasal cannula at this time.  Cultures so far negative to date.   She looks much better today with very minimal requirement oxygen.  # ESRD ([facility]): With history of failed renal transplant and now back on renal replacement therapy with hemodialysis.  Continue Monday/Wednesday/Friday schedule via left forearm AV fistula.  She remains on sirolimus and low-dose prednisone. Status post HD on 3/18 with around 1.7 L UF.  Tolerated well.  Plan for next HD on 3/21.  # Anemia: Without overt blood loss, defer intravenous iron (ferritin 630 with iron saturation 17%) until infection resolved and monitor on ESA.  # CKD-MBD: Current calcium and phosphorus level within acceptable range on low-dose sevelamer, will continue to follow trends.  # Nutrition: Continue renal diet with fluid restriction, on renal multivitamin.  # Hypertension: Blood pressure within acceptable range, continue to monitor with hemodialysis/UF.  Subjective: Seen and examined.  She feels much better today and wanted to go home.  Shortness of breath is much better as well as cough.  No chest pain.  She is afebrile.  Tolerated dialysis yesterday.  Objective Vital signs in last 24 hours: Vitals:   07/15/20 2320 07/16/20 0516 07/16/20 0753 07/16/20 0840  BP: (!) 134/93 136/89  (!) 156/104  Pulse: 96 100  (!) 108  Resp: 17 20  20   Temp: 98.2 F (36.8 C) 98.3 F (36.8 C)  98.3 F (36.8 C)  TempSrc: Oral Oral  Oral  SpO2: 97% 95% 97% 95%  Weight:      Height:       Weight change:   Intake/Output Summary (Last 24 hours) at 07/16/2020 0934 Last data filed at 07/15/2020 1235 Gross per 24 hour  Intake --  Output 1711 ml  Net -1711 ml       Labs: Basic Metabolic  Panel: Recent Labs  Lab 07/13/20 0236 07/13/20 0810 07/15/20 0945  NA  --  137 137  K  --  3.7 3.4*  CL  --  100 102  CO2  --  24 21*  GLUCOSE  --  93 87  BUN  --  35* 38*  CREATININE  --  8.96* 9.18*  CALCIUM  --  8.2* 8.6*  PHOS 3.6 4.0 4.2   Liver Function Tests: Recent Labs  Lab 07/13/20 0810 07/15/20 0945  ALBUMIN 2.5* 2.4*   No results for input(s): LIPASE, AMYLASE in the last 168 hours. No results for input(s): AMMONIA in the last 168 hours. CBC: Recent Labs  Lab 07/12/20 1112 07/13/20 0810 07/15/20 0945  WBC  --  10.1 8.9  HGB  --  8.3* 7.4*  HCT  --  25.1* 23.3*  MCV  --  90.0 90.7  PLT 468* 453* 420*   Cardiac Enzymes: No results for input(s): CKTOTAL, CKMB, CKMBINDEX, TROPONINI in the last 168 hours. CBG: Recent Labs  Lab 07/10/20 0806 07/10/20 1133 07/10/20 1703 07/10/20 2134 07/11/20 0724  GLUCAP 135* 164* 135* 151* 124*    Iron Studies: No results for input(s): IRON, TIBC, TRANSFERRIN, FERRITIN in the last 72 hours. Studies/Results: No results found.  Medications: Infusions: . ceFEPime (MAXIPIME) IV 1 g (07/15/20 2141)    Scheduled Medications: . acidophilus  1 capsule Oral Daily  . acyclovir  400 mg Oral Daily  . amLODipine  2.5 mg  Oral Daily  . benzonatate  200 mg Oral BID  . buPROPion  100 mg Oral Daily  . Chlorhexidine Gluconate Cloth  6 each Topical Q0600  . [START ON 07/18/2020] darbepoetin (ARANESP) injection - DIALYSIS  60 mcg Intravenous Q Mon-HD  . escitalopram  20 mg Oral Daily  . feeding supplement  1 Container Oral TID BM  . fluticasone  1 spray Each Nare Daily  . guaiFENesin  1,200 mg Oral BID  . heparin  5,000 Units Subcutaneous Q12H  . ipratropium-albuterol  3 mL Nebulization QID  . losartan  50 mg Oral Daily  . mouth rinse  15 mL Mouth Rinse BID  . melatonin  5 mg Oral QHS  . multivitamin  1 tablet Oral QHS  . predniSONE  5 mg Oral Q breakfast  . senna-docusate  1 tablet Oral Daily  . sevelamer carbonate  800  mg Oral TID WC  . simvastatin  10 mg Oral q1800  . Sirolimus  2 mg Oral Daily    have reviewed scheduled and prn medications.  Physical Exam: General:NAD, comfortable, able to lie flat Heart:RRR, s1s2 nl Lungs: Some rales bilateral Abdomen:soft, Non-tender, non-distended Extremities: Trace LE edema Dialysis Access: Left upper extremity has AV fistula with some thrill and bruit.  Valentino Saavedra Prasad Rasul Decola 07/16/2020,9:34 AM  LOS: 9 days

## 2020-07-16 NOTE — Progress Notes (Signed)
NAME:  Kaitlyn Schwartz, MRN:  [id number], DOB:  [birth year], LOS: 9 ADMISSION DATE:  07/07/2020, CONSULTATION DATE:  07/08/2020 REFERRING MD:  Dr. Darrick Meigs, [facility] CHIEF COMPLAINT:  Fever, SOB  Brief History:  35 yo female with hx of renal transplant in 2005 was treated for pneumonia at [facility] in February 2022.  She was d/c home on oral antibiotics.  She developed recurrence of dyspnea and cough associated with low grade fever.  She was started on broad spectrum antibiotics for HCAP.  Procalcitonin on admission 86.33.  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, HLD  Significant Hospital Events:  3/10 Admit 3/12 bronchoscopy 3/17 PCCM s/o 3/19 PCCM asked to reassess due to ongoing increased O2 needs; transfuse 1 unit PRBC  Consults:  Nephrology  Procedures:    Significant Diagnostic Tests:   CT angio chest 3/10 >> extensive multifocal GGO b/l  Bronchoscopy 3/12 >> 225 WBC (78%lymphocytes)  Micro Data:  COVID/Flu 3/10 >> negative MRSA PCR 3/10 >> negative Blood 3/10 >> negative Legionella Ag 3/10 >> negative Pneumococcal Ag 3/10 >>  Mycoplamsa IgM 3/11 >> negative HIV 3/11 >> non reactive BAL 3/12 >> negative BAL fungal 3/12 >> BAL AFB 3/12 >>  Fungitell 3/12 >> 33 (nl < 80)  Antimicrobials:  Vancomycin 3/10 >> 3/11 Cefepime 3/10 >>   Interim History / Subjective:  She is very anxious to go home.  She feels her breathing has improved.  Still has dry cough, but better.    Objective   Blood pressure (!) 156/104, pulse (!) 108, temperature 98.3 F (36.8 C), temperature source Oral, resp. rate 20, height _0  (1.575 m), weight 72 kg, last menstrual period 06/29/2020, SpO2 95 %.        Intake/Output Summary (Last 24 hours) at 07/16/2020 1231 Last data filed at 07/15/2020 1235 Gross per 24 hour  Intake --  Output 1711 ml  Net -1711 ml   Filed Weights   07/13/20 1105 07/15/20 0935 07/15/20 1235   Weight: 73.5 kg 72.7 kg 72 kg   Physical Exam:  General - alert Eyes - pupils reactive ENT - no sinus tenderness, no stridor Cardiac - regular rate/rhythm, no murmur Chest - equal breath sounds b/l, no wheezing or rales Abdomen - soft, non tender, + bowel sounds Extremities - no cyanosis, clubbing, or edema Skin - no rashes Neuro - normal strength, moves extremities, follows commands Psych - normal mood and behavior  Assessment & Plan:   Multifocal pneumonia in setting of renal transplant on chronic immunosuppressive therapy. - clinically improved and chest xray from 07/16/20 much improved - her O2 needs are in an acceptable range for hospital discharge - she should be set up with a pulse oximeter to monitor SpO2 at home, and goal SpO2 should by > 90% - she will need outpt pulmonary follow up to monitor clearing of chest xray and assess longer term supplemental oxygen needs - can change BDs to prn  Snoring with sleep disruption. - noted to have oxygen desaturation while asleep in hospital also - will need to be assessed for sleep apnea once she has fully recovered from pneumonia  Anemia of chronic disease. - getting one unit PRBC on 3/19  ESRD with hx of renal transplant. - followed by nephrology at [facility] - remains on sirolimus 2 mg daily and prednisone 5 mg daily  Hx CMV infection - continue acyclovir  Hx of hypertension. - per primary team and nephrology  Disposition. - if she is discharged home over the weekend, then will have pulmonary office contact her on 07/18/20 to arrange for pulmonary office follow up in 2 weeks after discharge  Labs:   CMP Latest Ref Rng & Units 07/15/2020 07/13/2020 07/09/2020  Glucose 70 - 99 mg/dL 87 93 84  BUN 6 - 20 mg/dL 38(H) 35(H) 5(L)  Creatinine 0.44 - 1.00 mg/dL 9.18(H) 8.96(H) 4.47(H)  Sodium 135 - 145 mmol/L 137 137 138  Potassium 3.5 - 5.1 mmol/L 3.4(L) 3.7 4.0  Chloride 98 - 111 mmol/L 102 100 99  CO2 22 - 32 mmol/L 21(L) 24  29  Calcium 8.9 - 10.3 mg/dL 8.6(L) 8.2(L) 9.1  Total Protein 6.5 - 8.1 g/dL - - -  Total Bilirubin 0.3 - 1.2 mg/dL - - -  Alkaline Phos 38 - 126 U/L - - -  AST 15 - 41 U/L - - -  ALT 0 - 44 U/L - - -    CBC Latest Ref Rng & Units 07/15/2020 07/13/2020 07/12/2020  WBC 4.0 - 10.5 K/uL 8.9 10.1 -  Hemoglobin 12.0 - 15.0 g/dL 7.4(L) 8.3(L) -  Hematocrit 36.0 - 46.0 % 23.3(L) 25.1(L) -  Platelets 150 - 400 K/uL 420(H) 453(H) 468(H)    Signature:  Chesley Mires, MD [place] Pager - [phone] 07/16/2020, 2:12 PM

## 2020-07-16 NOTE — Progress Notes (Signed)
SATURATION QUALIFICATIONS: (This note is used to comply with regulatory documentation for home oxygen)  Patient Saturations on Room Air at Rest = 88%  Patient Saturations on Room Air while Ambulating = 71%  Patient Saturations on 8 Liters of oxygen while Ambulating = 92%  Please briefly explain why patient needs home oxygen:  Pt required 8LNC of oxygen to increase O2 sats to 92%. With a 1 minute rest patient was able to be decreased to [facility] and maintain at 90%. Pt then decreased to 4L at maintained between 88 at 90%.

## 2020-07-17 LAB — BPAM RBC
Blood Product Expiration Date: 202204202359
ISSUE DATE / TIME: 202203191439
Unit Type and Rh: 5100

## 2020-07-17 LAB — TYPE AND SCREEN
ABO/RH(D): O POS
Antibody Screen: NEGATIVE
Unit division: 0

## 2020-07-18 NOTE — Telephone Encounter (Signed)
Called spoke with patient  She is scheduled for 08/02/20 with Rexene Edison NP for hospital follow up   Nothing further needed at this time.

## 2020-07-25 ENCOUNTER — Inpatient Hospital Stay: Payer: Managed Care, Other (non HMO) | Admitting: Adult Health

## 2020-07-25 ENCOUNTER — Ambulatory Visit (INDEPENDENT_AMBULATORY_CARE_PROVIDER_SITE_OTHER): Payer: Managed Care, Other (non HMO) | Admitting: Primary Care

## 2020-07-25 ENCOUNTER — Other Ambulatory Visit: Payer: Self-pay

## 2020-07-25 ENCOUNTER — Ambulatory Visit (INDEPENDENT_AMBULATORY_CARE_PROVIDER_SITE_OTHER): Payer: Managed Care, Other (non HMO)

## 2020-07-25 ENCOUNTER — Encounter: Payer: Self-pay | Admitting: Primary Care

## 2020-07-25 VITALS — BP 118/78 | HR 100 | Ht 62.0 in | Wt 161.0 lb

## 2020-07-25 DIAGNOSIS — J9601 Acute respiratory failure with hypoxia: Secondary | ICD-10-CM

## 2020-07-25 DIAGNOSIS — J189 Pneumonia, unspecified organism: Secondary | ICD-10-CM | POA: Diagnosis not present

## 2020-07-25 DIAGNOSIS — G4734 Idiopathic sleep related nonobstructive alveolar hypoventilation: Secondary | ICD-10-CM

## 2020-07-25 NOTE — Assessment & Plan Note (Addendum)
-   O2 sustaining 93% on 2-3L, patient increased liter flow to 4-5L d.t dyspnea. Needing frequent breaks/rest while walking. Recommend rechecking at follow-up in 4 weeks, may qualify for POC

## 2020-07-25 NOTE — Progress Notes (Signed)
Reviewed and agree with assessment/plan.   Chesley Mires, MD [facility] Pulmonary/Critical Care 07/25/2020, 1:53 PM Pager:  [phone]

## 2020-07-25 NOTE — Assessment & Plan Note (Addendum)
-   Needs outpatient sleep evaluation once she has fully recovered from PNA - She has an apt with Dr. Halford Chessman in May

## 2020-07-25 NOTE — Patient Instructions (Addendum)
Nice meeting you today Ms Gorney  Recommendations: - Continue Spiriva respimat 1.72mcg two puff daily in the morning - Use Albuterol 2 puffs every 4-6 hours for shortness of breath/wheezing (with space) - If you have Incentive spirometer please take 10 deeps breaths every hour while awake   Orders: - CXR today re: HCAP - Ambulatory walk (titrate O2 L to keep >88-90%)  Follow-up: - Please schedule hospital fu on April 26th with Dr. Loanne Drilling  - Keep apt with Dr. Halford Chessman of sleep consult May 13th  *Please give patient DPR form

## 2020-07-25 NOTE — Assessment & Plan Note (Addendum)
-   Clinically improving. She still has a cough with clear mucus. Continues to require supplemental oxygen but less L/flow. Using SABA 3-4 times a day.  - Continue Spiriva respimat 1.98mcg; Albuterol hfa 2 puffs every 4-6 hours for shortness of breath/wheezing  - Recommend she use Incentive spirometer 10/hr while awake and adding spacer to use with HFA  - Checking repeat CXR today re: HCAP - FU in 4 weeks with Dr. Loanne Drilling (saw patient 3/13)

## 2020-07-25 NOTE — Progress Notes (Signed)
_0  ID: Shepard General, female    DOB: 09-Jan-1986, 35 y.o.   MRN: [id number]  Chief Complaint  Patient presents with  . Hospitalization Follow-up    Pt states she is slowly improving since being out of the hospital. Pt wears 5L O2 with exertion and is having to wear 3L with POC and wears 4-5L with home concentrator    Referring provider: Marda Stalker, PA-C  HPI: 35 year old female, never smoked.  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    Patient recently admitted from 07/07/2020-07/16/2020 for ongoing cough and shortness of breath.  She was treated for pneumonia in February 2022 at [facility]  She was discharged home on antibiotics.  She developed recurrent dyspnea and cough with low-grade fever.  Her oxygen saturation was 75% on room air while in the emergency room.  CT chest showed multifocal pneumonia.  Patient was admitted to the hospital with pulmonary consultation.  She underwent bronchoscopy with bronchial lavage. She was started on broad-spectrum antibiotics for HCAP.  Prolactin was 86.33. Completed 10 days of cefepime on 3/19.  Chest x-ray on 07/16/2020 much improved.  Oxygenation improving on 1 to 2 L at rest, 6-8 L with exertion.  Noted to have oxygen desaturations while sleeping in hospital, will need to be assessed for sleep apnea once fully recovered from pneumonia.  Patient discharged on supplemental oxygen.  No further antibiotics needed.  Change bronchodilators to as needed.  07/25/2020 - interim hx Patient presents today for hospital follow-up. Accompanied by he mother. Her breathing has improved some since hospitalization. She has a productive cough with clear mucus. Her oxygen use differs depending on whether she is using  continuous tank versus home concentrator. Currently using 4-5L oxygen with tank and 6L on concentraor. She wears 5L oxygen at night. Using 50 ft O2 tubing at home. On days she has more water weight such as MWF she requires more oxygen.  Her PCP put her on Spiriva respimat 1.92mg daily. She uses albuterol rescue inhaler 3-4 times a day which makes her cough. She has been on 574mprednisone daily for the past 17 years. She continues with HD MWF. Weight today 160 (72kg).    Testing CT angio 07/07/2020>> extensive multifocal groundglass opacities bilaterally Bronchoscopy 07/09/2020>> 225 WBC (78% lymphocytes), AFB negative, Sputum no growth BAL 07/09/2020>> negative Covid negative   Allergies  Allergen Reactions  . Augmentin [Amoxicillin-Pot Clavulanate] Diarrhea and Nausea And Vomiting  . Lisinopril Shortness Of Breath    Other reaction(s): Cough   . Vancomycin Itching  . Versed [Midazolam] Anxiety  . Azithromycin     IV only  . Cellcept [Mycophenolate] Diarrhea  . Dobutamine     Other reaction(s): HTN, bradycardia  . Ibuprofen     Other reaction(s): *Unknown Pt was told not to take ibuprofen after kidney transplant - Sep 10, 2003    Immunization History  Administered Date(s) Administered  . Influenza Inj Mdck Quad Pf 01/22/2017, 12/30/2019  . Influenza Split 01/27/2015, 01/22/2017  . Influenza, Seasonal, Injecte, Preservative Fre 01/27/2015  . Influenza,inj,Quad PF,6+ Mos 01/24/2015, 03/11/2018  . Influenza,inj,Quad PF,6-35 Mos 03/13/2016  . Influenza,inj,quad, With Preservative 03/12/2016  . Moderna Sars-Covid-2 Vaccination 01/01/2020  . PFIZER(Purple Top)SARS-COV-2 Vaccination 07/15/2019, 08/15/2019  . Tdap 08/08/2016    Past Medical History:  Diagnosis Date  . Blood transfusion without  reported diagnosis   . Cancer ([place])   . Complication of anesthesia    Versed - prolonged extreme anxiety attack  . Family history of adverse reaction to anesthesia    mother's father  .  Neurofibromatosis ([place])   . Renal disorder   . Renal transplant, status post 2005    Tobacco History: Social History   Tobacco Use  Smoking Status Never Smoker  Smokeless Tobacco Never Used   Counseling given: Not Answered   Outpatient Medications Prior to Visit  Medication Sig Dispense Refill  . acetaminophen (TYLENOL) 500 MG tablet Take 1,000 mg by mouth every 8 (eight) hours as needed for fever or moderate pain.    Marland Kitchen acyclovir (ZOVIRAX) 400 MG tablet Take 400 mg by mouth daily.    Marland Kitchen amLODipine (NORVASC) 2.5 MG tablet Take 1 tablet (2.5 mg total) by mouth daily. 30 tablet 0  . benzonatate (TESSALON) 200 MG capsule Take 1 capsule (200 mg total) by mouth 2 (two) times daily. 20 capsule 0  . buPROPion (WELLBUTRIN) 100 MG tablet Take 100 mg by mouth daily.    . calcium carbonate (OS-CAL) 1250 (500 Ca) MG chewable tablet Chew 2 tablets by mouth daily as needed for heartburn.    . diphenhydrAMINE (BENADRYL) 25 MG tablet Take 25 mg by mouth daily as needed for itching or allergies.    Marland Kitchen escitalopram (LEXAPRO) 20 MG tablet Take 20 mg by mouth daily.    . famotidine (PEPCID) 10 MG tablet Take 10 mg by mouth daily.    Marland Kitchen gabapentin (NEURONTIN) 300 MG capsule Take 300 mg by mouth See admin instructions. Takes only on Dialysis days ( Monday, Wednesday ana Friday)    . Lactobacillus Rhamnosus, GG, (CULTURELLE) CAPS Take 1 capsule by mouth daily at 6 (six) AM.    . losartan (COZAAR) 50 MG tablet Take 50 mg by mouth daily.    . melatonin 5 MG TABS Take 5 mg by mouth at bedtime.    . methylphenidate 18 MG PO CR tablet Take 18 mg by mouth daily. Takes only Monday through Friday.    . multivitamin (RENA-VIT) TABS tablet Take 1 tablet by mouth daily.    . norethindrone (AYGESTIN) 5 MG tablet Take 5 mg by mouth daily.    . ondansetron (ZOFRAN) 4 MG tablet Take 4 mg by mouth every 8 (eight) hours as needed for nausea or vomiting.    . predniSONE (DELTASONE) 5 MG tablet Take 5 mg by mouth daily.    .  sevelamer (RENAGEL) 800 MG tablet Take 1,600 mg by mouth 3 (three) times daily.    . simvastatin (ZOCOR) 10 MG tablet Take 10 mg by mouth daily.    . sirolimus (RAPAMUNE) 1 MG tablet Take 2 mg by mouth daily.    Marland Kitchen SPIRIVA RESPIMAT 1.25 MCG/ACT AERS SMARTSIG:2 Puff(s) Via Inhaler Daily    . SUMAtriptan (IMITREX) 50 MG tablet Take 50 mg by mouth daily as needed for migraine.    . Tiotropium Bromide Monohydrate 1.25 MCG/ACT AERS Inhale 2 puffs into the lungs daily.    Marland Kitchen albuterol (VENTOLIN HFA) 108 (90 Base) MCG/ACT inhaler Inhale 2 puffs into the lungs every 4 (four) hours as needed for wheezing.     No facility-administered medications prior to visit.   Review of Systems  Review of Systems  Constitutional: Negative.   HENT: Negative.   Respiratory: Positive for cough and shortness of breath. Negative for chest tightness and wheezing.   Cardiovascular: Negative.  Physical Exam  BP 118/78 (BP Location: Right Arm, Cuff Size: Normal)   Pulse 100   Ht _0  (1.575 m)   Wt 161 lb (73 kg)   LMP 06/29/2020   SpO2 97% Comment: 3L continuous  BMI 29.45 kg/m  Physical Exam Constitutional:      Appearance: Normal appearance.  HENT:     Mouth/Throat:     Mouth: Mucous membranes are moist.     Pharynx: Oropharynx is clear.  Cardiovascular:     Rate and Rhythm: Normal rate and regular rhythm.     Comments: RRR Pulmonary:     Effort: Pulmonary effort is normal.     Breath sounds: Normal breath sounds. No wheezing, rhonchi or rales.     Comments: CTA Skin:    Comments: Left AV fistula CDI  Neurological:     General: No focal deficit present.     Mental Status: She is alert and oriented to person, place, and time. Mental status is at baseline.  Psychiatric:        Mood and Affect: Mood normal.        Behavior: Behavior normal.        Thought Content: Thought content normal.        Judgment: Judgment normal.      Lab Results:  CBC    Component Value Date/Time   WBC 8.9  07/15/2020 0945   RBC 2.57 (L) 07/15/2020 0945   HGB 7.4 (L) 07/15/2020 0945   HCT 23.3 (L) 07/15/2020 0945   PLT 420 (H) 07/15/2020 0945   MCV 90.7 07/15/2020 0945   MCH 28.8 07/15/2020 0945   MCHC 31.8 07/15/2020 0945   RDW 12.5 07/15/2020 0945   LYMPHSABS 1.3 07/07/2020 1411   MONOABS 0.8 07/07/2020 1411   EOSABS 0.2 07/07/2020 1411   BASOSABS 0.1 07/07/2020 1411    BMET    Component Value Date/Time   NA 137 07/15/2020 0945   K 3.4 (L) 07/15/2020 0945   CL 102 07/15/2020 0945   CO2 21 (L) 07/15/2020 0945   GLUCOSE 87 07/15/2020 0945   BUN 38 (H) 07/15/2020 0945   CREATININE 9.18 (H) 07/15/2020 0945   CALCIUM 8.6 (L) 07/15/2020 0945   GFRNONAA 5 (L) 07/15/2020 0945    BNP No results found for: BNP  ProBNP No results found for: PROBNP  Imaging: CT ABDOMEN PELVIS WO CONTRAST  Result Date: 07/03/2020 CLINICAL DATA:  Abdominal pain EXAM: CT ABDOMEN AND PELVIS WITHOUT CONTRAST TECHNIQUE: Multidetector CT imaging of the abdomen and pelvis was performed following the standard protocol without IV contrast. COMPARISON:  None. FINDINGS: Lower chest: Bibasilar consolidative airspace opacities. Trace RIGHT pleural effusion. Hepatobiliary: Unremarkable noncontrast appearance of the liver. Gallbladder is unremarkable. Pancreas: No peripancreatic fat stranding. Spleen: Lobulated spleen. Adrenals/Urinary Tract: Adrenal glands are unremarkable. Atrophic native kidneys with incompletely characterized subcentimeter hypodense lesions of the LEFT kidney. Renal transplant in the RIGHT lower quadrant without evidence of hydronephrosis. Bladder is completely decompressed. Mild fat stranding adjacent to the bladder. Stomach/Bowel: Appendix is normal. No evidence of bowel obstruction. There is mild circumferential colonic wall thickening most pronounced in the transverse and descending colon with mild adjacent fat stranding. Vascular/Lymphatic: No significant vascular findings are present. No enlarged  abdominal or pelvic lymph nodes. Reproductive: Uterus and bilateral adnexa are unremarkable. Other: Trace free fluid. Musculoskeletal: No acute or significant osseous findings. IMPRESSION: 1. Mild circumferential colonic wall thickening most pronounced in the transverse and descending colon with mild adjacent fat stranding. This is  nonspecific and could reflect an infectious or inflammatory colitis in the appropriate clinical setting. 2. Mild fat stranding adjacent to the bladder. Correlate with urinalysis to exclude cystitis. 3. Bibasilar consolidative airspace opacities and trace RIGHT pleural effusion, consistent with history of pneumonia. 4. Normal appendix. Electronically Signed   By: Valentino Saxon MD   On: 07/03/2020 12:59   DG Chest 1 View  Result Date: 07/09/2020 CLINICAL DATA:  Respiratory distress syndrome EXAM: CHEST  1 VIEW COMPARISON:  07/07/2020 FINDINGS: Stable cardiomediastinal contours. Continued progression of bilateral airspace opacities, most confluent within the lung bases. Small bilateral pleural effusions. No pneumothorax. IMPRESSION: Continued progression of bilateral airspace opacities, most confluent within the lung bases. Electronically Signed   By: Davina Poke D.O.   On: 07/09/2020 12:48   CT Angio Chest PE W/Cm &/Or Wo Cm  Result Date: 07/07/2020 CLINICAL DATA:  Pneumonia 3 weeks ago shortness of breath EXAM: CT ANGIOGRAPHY CHEST WITH CONTRAST TECHNIQUE: Multidetector CT imaging of the chest was performed using the standard protocol during bolus administration of intravenous contrast. Multiplanar CT image reconstructions and MIPs were obtained to evaluate the vascular anatomy. CONTRAST:  64m OMNIPAQUE IOHEXOL 350 MG/ML SOLN COMPARISON:  None. FINDINGS: Cardiovascular: There is a optimal opacification of the pulmonary arteries. There is no central,segmental, or subsegmental filling defects within the pulmonary arteries. The heart is normal in size. A small pericardial  effusion is seen. No evidence right heart strain. There is normal three-vessel brachiocephalic anatomy without proximal stenosis. The thoracic aorta is normal in appearance. Mediastinum/Nodes: No hilar, mediastinal, or axillary adenopathy. Thyroid gland, trachea, and esophagus demonstrate no significant findings. Lungs/Pleura: Extensive multifocal patchy ground-glass opacities are seen throughout both lungs. There is a small right and trace left pleural effusion present. Upper Abdomen: No acute abnormalities present in the visualized portions of the upper abdomen. Musculoskeletal: No chest wall abnormality. No acute or significant osseous findings. Mild S-shaped scoliotic curvature is noted. Review of the MIP images confirms the above findings. IMPRESSION: No central, segmental, or subsegmental pulmonary embolism Extensive multifocal airspace opacities, consistent with multifocal pneumonia Small pericardial effusion Small right and trace left pleural effusion. Electronically Signed   By: BPrudencio PairM.D.   On: 07/07/2020 16:56   DG CHEST PORT 1 VIEW  Result Date: 07/16/2020 CLINICAL DATA:  Acute respiratory failure. EXAM: PORTABLE CHEST 1 VIEW COMPARISON:  Single-view of the chest 07/09/2020 and 07/13/2020. FINDINGS: There is patchy bilateral airspace disease which is markedly improved compared to the most recent exam. Small right pleural effusion is noted. No pneumothorax. Heart size is normal. IMPRESSION: Marked improvement in bilateral airspace disease. Small right pleural effusion noted. Electronically Signed   By: TInge RiseM.D.   On: 07/16/2020 12:22   DG Chest Port 1 View  Result Date: 07/13/2020 CLINICAL DATA:  Abnormal respiration with shortness of breath EXAM: PORTABLE CHEST 1 VIEW COMPARISON:  Four days ago FINDINGS: Extensive bilateral airspace disease with left upper lobe progression. Lung volumes remain low. Normal heart size. No visible effusion or pneumothorax IMPRESSION: Bilateral  airspace disease that is progressed from 4 days ago. Electronically Signed   By: JMonte FantasiaM.D.   On: 07/13/2020 08:08   DG Chest Portable 1 View  Result Date: 07/07/2020 CLINICAL DATA:  Shortness of breath EXAM: PORTABLE CHEST 1 VIEW COMPARISON:  July 03, 2020 FINDINGS: There is airspace opacity in the lower lobe regions, slightly progressed. There is new infiltrate throughout portions of the left and right mid lung regions and portions  of the right upper lobe. Heart size and pulmonary vascular normal. No adenopathy. No bone lesions. IMPRESSION: Multifocal pneumonia with progression of infiltrate in the right upper lobe as well as in the mid lung regions. Extensive infiltrate in each lower lobe persists without significant change from recent study. Question atypical organism pneumonia given this appearance. Check of COVID-19 status advised. Heart size normal.  No adenopathy evident. Electronically Signed   By: Lowella Grip III M.D.   On: 07/07/2020 15:08   DG Chest Portable 1 View  Result Date: 07/03/2020 CLINICAL DATA:  Cough and shortness of breath. EXAM: PORTABLE CHEST 1 VIEW COMPARISON:  None. FINDINGS: The cardiomediastinal silhouette is unremarkable. Patchy airspace opacities within both mid and lower lungs are noted. There is no evidence of pleural effusion, pneumothorax or acute bony abnormality. IMPRESSION: Patchy bilateral airspace opacities compatible with infection/pneumonia. Electronically Signed   By: Margarette Canada M.D.   On: 07/03/2020 10:44     Assessment & Plan:   Multifocal pneumonia - Clinically improving. She still has a cough with clear mucus. Continues to require supplemental oxygen but less L/flow. Using SABA 3-4 times a day.  - Continue Spiriva respimat 1.21mg; Albuterol hfa 2 puffs every 4-6 hours for shortness of breath/wheezing  - Recommend she use Incentive spirometer 10/hr while awake and adding spacer to use with HFA  - Checking repeat CXR today re: HCAP -  FU in 4 weeks with Dr. ELoanne Drilling(saw patient 3/13)  Acute respiratory failure with hypoxia ([place] - O2 sustaining 93% on 2-3L, patient increased liter flow to 4-5L d.t dyspnea. Needing frequent breaks/rest while walking. Recommend rechecking at follow-up in 4 weeks, may qualify for POC   Nocturnal oxygen desaturation - Needs outpatient sleep evaluation once she has fully recovered from PNA - She has an apt with Dr. SHalford Chessmanin May    EMartyn Ehrich NP 07/25/2020

## 2020-07-26 ENCOUNTER — Other Ambulatory Visit: Payer: Self-pay

## 2020-07-26 DIAGNOSIS — J189 Pneumonia, unspecified organism: Secondary | ICD-10-CM

## 2020-07-26 NOTE — Progress Notes (Signed)
Noted. Thanks.

## 2020-07-26 NOTE — Progress Notes (Signed)
Please let patient know CXR showed unchanged pneumonia, it again can take 4-6 weeks to improve. I would repeat CXR at next visit with Dr. Loanne Drilling prior to apt. Call us if she develops worsening symptoms, clinically I think she continues to improve just slowly

## 2020-07-26 NOTE — Progress Notes (Signed)
Called and went over xray results per E. Volanda Napoleon NP with patient. All questions answered and patient expressed full understanding of results and recommendations. Patient agreeable to having repeat CXR at next visit with Dr Loanne Drilling. Order placed and patient aware. Confirmed scheduled office visit with Dr Loanne Drilling on 08/23/2020 @11 :30am. Nothing further needed at this time.

## 2020-07-30 LAB — CULTURE, FUNGUS WITHOUT SMEAR

## 2020-08-02 ENCOUNTER — Inpatient Hospital Stay: Payer: Managed Care, Other (non HMO) | Admitting: Adult Health

## 2020-08-10 LAB — FUNGUS CULTURE WITH STAIN

## 2020-08-10 LAB — FUNGAL ORGANISM REFLEX

## 2020-08-10 LAB — FUNGUS CULTURE RESULT

## 2020-08-22 ENCOUNTER — Other Ambulatory Visit: Payer: Self-pay

## 2020-08-22 ENCOUNTER — Ambulatory Visit: Payer: Managed Care, Other (non HMO)

## 2020-08-22 DIAGNOSIS — J189 Pneumonia, unspecified organism: Secondary | ICD-10-CM

## 2020-08-22 NOTE — Progress Notes (Signed)
Spoke with patient about cancelling her appt with Dr Loanne Drilling on 4/26 due to being scheduled to establish care with Dr Halford Chessman on 5/3. Patient okay with the change but did request to have a chest xray done to see her progress. Spoke with [employer] and she is okay with doing a follow up xray at this time.

## 2020-08-23 ENCOUNTER — Ambulatory Visit (INDEPENDENT_AMBULATORY_CARE_PROVIDER_SITE_OTHER): Payer: Managed Care, Other (non HMO)

## 2020-08-23 ENCOUNTER — Ambulatory Visit: Payer: Managed Care, Other (non HMO) | Admitting: Pulmonary Disease

## 2020-08-23 DIAGNOSIS — J189 Pneumonia, unspecified organism: Secondary | ICD-10-CM

## 2020-08-23 NOTE — Progress Notes (Signed)
Please let patient know CXR showed partial but incomplete clearing of pneumonia. She has follow-up in a couple days with Dr. Halford Chessman. Recommend reviewing during visit. If still having symptoms may need additional work up or follow-up imaging

## 2020-08-23 NOTE — Progress Notes (Signed)
Called and went over xray results per E. Volanda Napoleon NP with patient. All questions answered and patient expressed full understanding. Confirmed upcoming scheduled office visit with Dr Halford Chessman on 08/30/2020 @ 11am. Patient aware will go over in more detail at office visit. Nothing further needed at this time.

## 2020-08-26 LAB — ACID FAST CULTURE WITH REFLEXED SENSITIVITIES (MYCOBACTERIA)
Acid Fast Culture: NEGATIVE
Acid Fast Culture: NEGATIVE
Acid Fast Culture: NEGATIVE
Acid Fast Culture: NEGATIVE

## 2020-08-30 ENCOUNTER — Encounter: Payer: Self-pay | Admitting: Pulmonary Disease

## 2020-08-30 ENCOUNTER — Other Ambulatory Visit: Payer: Self-pay

## 2020-08-30 ENCOUNTER — Ambulatory Visit: Payer: Managed Care, Other (non HMO)

## 2020-08-30 ENCOUNTER — Ambulatory Visit (INDEPENDENT_AMBULATORY_CARE_PROVIDER_SITE_OTHER): Payer: Managed Care, Other (non HMO) | Admitting: Pulmonary Disease

## 2020-08-30 VITALS — BP 112/68 | HR 105 | Temp 98.1°F | Ht 62.0 in | Wt 157.0 lb

## 2020-08-30 DIAGNOSIS — J4540 Moderate persistent asthma, uncomplicated: Secondary | ICD-10-CM | POA: Diagnosis not present

## 2020-08-30 DIAGNOSIS — J9611 Chronic respiratory failure with hypoxia: Secondary | ICD-10-CM | POA: Diagnosis not present

## 2020-08-30 DIAGNOSIS — J301 Allergic rhinitis due to pollen: Secondary | ICD-10-CM | POA: Diagnosis not present

## 2020-08-30 DIAGNOSIS — R0683 Snoring: Secondary | ICD-10-CM

## 2020-08-30 DIAGNOSIS — Z8701 Personal history of pneumonia (recurrent): Secondary | ICD-10-CM | POA: Diagnosis not present

## 2020-08-30 MED ORDER — MONTELUKAST SODIUM 10 MG PO TABS: 10.0000 mg | ORAL_TABLET | Freq: Every day | ORAL | 5 refills | Status: DC

## 2020-08-30 NOTE — Addendum Note (Signed)
Addended by: Merrilee Seashore on: 08/30/2020 02:01 PM   Modules accepted: Orders

## 2020-08-30 NOTE — Patient Instructions (Addendum)
Singulair 10 mg pill nightly  If your breathing is better after starting singulair, then you can stop spiriva  Albuterol two puffs every 6 hours as needed for cough, wheeze, chest congestion or shortness of breath  Continue using 2 liters oxygen with exertion and sleep  Follow up in 4 weeks with a chest xray

## 2020-08-30 NOTE — Progress Notes (Signed)
[facility]  Chief Complaint  Patient presents with  . Consult    Follow up on chest xray, sinus congestion, non productive cough since March 2022    Constitutional:  BP 112/68 (BP Location: Right Arm, Cuff Size: Normal)   Pulse (!) 105   Temp 98.1 F (36.7 C) (Temporal)   Ht 5\' 2"  (1.575 m)   Wt 157 lb (71.2 kg)   SpO2 99% Comment: 3 Liters O2  BMI 28.72 kg/m   Past Medical History:  Neurofibromatosis, s/p Renal transplant 2005, HTN, HLD, CMV, Post transplant lymphoproliferative disorder  Past Surgical History:  She  has a past surgical history that includes Kidney transplant; throat biopsy; Tonsillectomy; Video bronchoscopy (Bilateral, 07/09/2020); Bronchial washings (07/09/2020); and Bronchial brushings (07/09/2020).  Brief Summary:  Kaitlyn Schwartz is a 35 y.o. female with hypoxia in setting of pneumonia, allergic asthma and rhinitis, and snoring.      Subjective:   She is here with her mother.   She was in hospital in March for non-resolving pneumonia. Chest xray from 08/23/20 showed persistent infiltrates bilaterally.  She is still using oxygen but feels her breathing is better.  Has trouble with tree pollen and allergies.  Having more sinus congestion and nose bleeds during this season.  She gets occasional wheeze and cough.  Not bring up sputum.  Snores some.  Energy level improving.  She had SpO2 drop to 87% while walking on room air today; improved and recovered to 94% on 2 liters.  Physical Exam:   Appearance - well kempt   ENMT - no sinus tenderness, no oral exudate, no LAN, Mallampati 3 airway, no stridor, crusting inside nares b/l  Respiratory - equal breath sounds bilaterally, no wheezing or rales  CV - s1s2 regular rate and rhythm, no murmurs  Ext - no clubbing, no edema  Skin - no rashes  Psych - normal mood and affect   Pulmonary testing:   Bronchoscopy 3/12 >> 225 WBC (78%lymphocytes)  Chest Imaging:    CT angio chest 07/07/20 >> extensive patchy GGO, small b/l effusions, scoliosis  Sleep Tests:    Cardiac Tests:    Social History:  She  reports that she has never smoked. She has never used smokeless tobacco. She reports previous alcohol use. She reports that she does not use drugs.  Family History:  Her family history includes Cancer in her father and mother; Hypertension in her father and mother.     Assessment/Plan:   Chronic hypoxic respiratory failure. - after episode of multifocal pneumonia in March 2022 - clinically improving - she still needs 2 liters supplemental oxygen with exertion and at night; re-assess at her next visit - chest xray has partially improved; explained it can take several months for full resolution - will repeat chest xray at next visit; she will need repeat CT chest at some time in the Fall  Snoring. - concern for obstructive sleep apnea - she would like to wait long for better recovery from pneumonia before assessing whether she would want to have a sleep study done  Allergic rhinitis with asthma. - she has allergy to tree pollen with Spring being worse time of year - will add singulair - continue albuterol prn - if symptoms better after adding singulair, then she can discontinue spiriva - explained she doesn't have to continue using incentive spirometry - will eventually PFT; will wait until pneumonia is full resolved  ESRD with hx of renal transplant. - followed  by Dr. Ludwig Clarks with nephrology at [facility] - she will need pulmonary clearance before being placed on transplant list again  Time Spent Involved in Patient Care on Day of Examination:  37 minutes  Follow up:  Patient Instructions  Singulair 10 mg pill nightly  If your breathing is better after starting singulair, then you can stop spiriva  Albuterol two puffs every 6 hours as needed for cough, wheeze, chest congestion or shortness of breath  Continue using 2 liters  oxygen with exertion and sleep  Follow up in 4 weeks with a chest xray   Medication List:   Allergies as of 08/30/2020      Reactions   Augmentin [amoxicillin-pot Clavulanate] Diarrhea, Nausea And Vomiting   Lisinopril Shortness Of Breath   Other reaction(s): Cough   Vancomycin Itching   Versed [midazolam] Anxiety   Azithromycin    IV only   Cellcept [mycophenolate] Diarrhea   Dobutamine    Other reaction(s): HTN, bradycardia   Ibuprofen    Other reaction(s): *Unknown Pt was told not to take ibuprofen after kidney transplant - Sep 10, 2003      Medication List       Accurate as of Aug 30, 2020 12:19 PM. If you have any questions, ask your nurse or doctor.        STOP taking these medications   benzonatate 200 MG capsule Commonly known as: TESSALON Stopped by: Chesley Mires, MD     TAKE these medications   acetaminophen 500 MG tablet Commonly known as: TYLENOL Take 1,000 mg by mouth every 8 (eight) hours as needed for fever or moderate pain.   acyclovir 400 MG tablet Commonly known as: ZOVIRAX Take 400 mg by mouth daily.   albuterol 108 (90 Base) MCG/ACT inhaler Commonly known as: VENTOLIN HFA Inhale 2 puffs into the lungs every 4 (four) hours as needed for wheezing.   amLODipine 10 MG tablet Commonly known as: NORVASC Take 10 mg by mouth daily. What changed: Another medication with the same name was removed. Continue taking this medication, and follow the directions you see here. Changed by: Chesley Mires, MD   buPROPion 100 MG tablet Commonly known as: WELLBUTRIN Take 100 mg by mouth daily.   calcium carbonate 1250 (500 Ca) MG chewable tablet Commonly known as: OS-CAL Chew 2 tablets by mouth daily as needed for heartburn.   Culturelle Caps Take 1 capsule by mouth daily at 6 (six) AM.   diphenhydrAMINE 25 MG tablet Commonly known as: BENADRYL Take 25 mg by mouth daily as needed for itching or allergies.   escitalopram 20 MG tablet Commonly known as:  LEXAPRO Take 20 mg by mouth daily.   famotidine 40 MG tablet Commonly known as: PEPCID Take 40 mg by mouth daily. What changed: Another medication with the same name was removed. Continue taking this medication, and follow the directions you see here. Changed by: Chesley Mires, MD   gabapentin 300 MG capsule Commonly known as: NEURONTIN Take 300 mg by mouth See admin instructions. Takes only on Dialysis days ( Monday, Wednesday ana Friday)   losartan 50 MG tablet Commonly known as: COZAAR Take 50 mg by mouth daily.   melatonin 5 MG Tabs Take 5 mg by mouth at bedtime.   methylphenidate 18 MG CR tablet Commonly known as: CONCERTA Take 18 mg by mouth daily. Takes only Monday through Friday.   montelukast 10 MG tablet Commonly known as: SINGULAIR Take 1 tablet (10 mg total) by mouth at bedtime.  Started by: Chesley Mires, MD   multivitamin Tabs tablet Take 1 tablet by mouth daily.   norethindrone 5 MG tablet Commonly known as: AYGESTIN Take 5 mg by mouth daily.   ondansetron 4 MG tablet Commonly known as: ZOFRAN Take 4 mg by mouth every 8 (eight) hours as needed for nausea or vomiting.   predniSONE 5 MG tablet Commonly known as: DELTASONE Take 5 mg by mouth daily.   sevelamer 800 MG tablet Commonly known as: RENAGEL Take 1,600 mg by mouth 3 (three) times daily.   simvastatin 10 MG tablet Commonly known as: ZOCOR Take 10 mg by mouth daily.   sirolimus 1 MG tablet Commonly known as: RAPAMUNE Take 2 mg by mouth daily.   Spiriva Respimat 1.25 MCG/ACT Aers Generic drug: Tiotropium Bromide Monohydrate 2 puffs daily.   SUMAtriptan 50 MG tablet Commonly known as: IMITREX Take 50 mg by mouth daily as needed for migraine.       Signature:  Chesley Mires, MD [place] Pager - [phone] 08/30/2020, 12:19 PM

## 2020-09-20 ENCOUNTER — Other Ambulatory Visit: Payer: Self-pay

## 2020-09-20 ENCOUNTER — Ambulatory Visit (INDEPENDENT_AMBULATORY_CARE_PROVIDER_SITE_OTHER): Payer: Managed Care, Other (non HMO) | Admitting: Primary Care

## 2020-09-20 ENCOUNTER — Encounter: Payer: Self-pay | Admitting: Primary Care

## 2020-09-20 ENCOUNTER — Ambulatory Visit (INDEPENDENT_AMBULATORY_CARE_PROVIDER_SITE_OTHER): Payer: Managed Care, Other (non HMO)

## 2020-09-20 VITALS — BP 128/80 | HR 105 | Temp 97.8°F | Ht 62.0 in | Wt 154.3 lb

## 2020-09-20 DIAGNOSIS — G4734 Idiopathic sleep related nonobstructive alveolar hypoventilation: Secondary | ICD-10-CM

## 2020-09-20 DIAGNOSIS — N186 End stage renal disease: Secondary | ICD-10-CM | POA: Insufficient documentation

## 2020-09-20 DIAGNOSIS — J4540 Moderate persistent asthma, uncomplicated: Secondary | ICD-10-CM | POA: Diagnosis not present

## 2020-09-20 DIAGNOSIS — J309 Allergic rhinitis, unspecified: Secondary | ICD-10-CM | POA: Diagnosis not present

## 2020-09-20 DIAGNOSIS — J189 Pneumonia, unspecified organism: Secondary | ICD-10-CM

## 2020-09-20 DIAGNOSIS — R0683 Snoring: Secondary | ICD-10-CM

## 2020-09-20 DIAGNOSIS — Z8701 Personal history of pneumonia (recurrent): Secondary | ICD-10-CM | POA: Diagnosis not present

## 2020-09-20 NOTE — Progress Notes (Signed)
_0  ID: Kaitlyn Schwartz, female    DOB: 04-Mar-1986, 35 y.o.   MRN: [id number]  Chief Complaint  Patient presents with  . Follow-up    Patient has not been wearing oxygen lately states that her O2 levels have been above 90% and she has not been wearing it at night either. Patient states that she is doing much better than previous visits. No concerns at this time. Patient is here for surgical clearance to get back on the list for a kidney.    Referring provider: Marda Stalker, PA-C  HPI:  35 year old female, never smoked.  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    Patient recently admitted from 07/07/2020-07/16/2020 for ongoing cough and shortness of breath.  She was treated for pneumonia in February 2022 at [facility].  She was discharged home on antibiotics.  She developed recurrent dyspnea and cough with low-grade fever.  Her oxygen saturation was 75% on room air while in the emergency room.  CT chest showed multifocal pneumonia.  Patient was admitted to the hospital with pulmonary consultation.  She underwent bronchoscopy with bronchial lavage. She was started on broad-spectrum antibiotics for HCAP.  Prolactin was 86.33. Completed 10 days of cefepime on 3/19.  Chest x-ray on 07/16/2020 much improved.  Oxygenation improving on 1 to 2 L at rest, 6-8 L with exertion.  Noted to have oxygen desaturations while sleeping in hospital, will need to be assessed for sleep apnea once fully recovered from pneumonia.  Patient discharged on supplemental oxygen.  No further antibiotics needed.  Change bronchodilators to as needed.  Previous [facility] pulmonary encounter:  07/25/2020  Patient presents today for hospital follow-up. Accompanied by he mother. Her  breathing has improved some since hospitalization. She has a productive cough with clear mucus. Her oxygen use differs depending on whether she is using continuous tank versus home concentrator. Currently using 4-5L oxygen with tank and 6L on concentraor. She wears 5L oxygen at night. Using 50 ft O2 tubing at home. On days she has more water weight such as MWF she requires more oxygen.  Her PCP put her on Spiriva respimat 1.33mg daily. She uses albuterol rescue inhaler 3-4 times a day which makes her cough. She has been on 557mprednisone daily for the past 17 years. She continues with HD MWF. Weight today 160 (72kg).   08/30/20- Follow-up Dr. SoHalford ChessmanShe was in hospital in March for non-resolving pneumonia. Chest xray from 08/23/20 showed persistent infiltrates bilaterally.  She is still using oxygen but feels her breathing is better.  Has trouble with tree pollen and allergies.  Having more sinus congestion and nose bleeds during this season.  She gets occasional wheeze and cough.  Not bring up sputum.  Snores some.  Energy level improving.  She had SpO2 drop to 87% while walking on room air today; improved and recovered to 94% on 2 liters.   09/20/2020- Interim hx  Patient presents today for surgical clearance. She is feeling a great deal better since she had multifocal pneumonia in March 2021. She has weaned herself off oxygen. She checks her levels with apple watch and pulse oximeter, reports O2 stayed above 90% on room air all weekend. She is compliant with Spiriva and Singulair. CXR today showed small ill defined opacity RML considered residual inflammation or scarring.  She was on the transplat list before multifocal pneumonia. She follows with [facility] nephrology for ESRD. Over the last 4 days she has found a live kidney donor, June 3rd she will know if donor has been cleared in [place]. Her donor's blood type is A positive and she is O positive. They will enter into swap program. Denies shortness of breath,  cough, chest tightness or wheezing.    Testing CT angio 07/07/2020>> extensive multifocal groundglass opacities bilaterally Bronchoscopy 07/09/2020>> 225 WBC (78% lymphocytes), AFB negative, Sputum no growth BAL 07/09/2020>> negative Covid negative   Allergies  Allergen Reactions  . Augmentin [Amoxicillin-Pot Clavulanate] Diarrhea and Nausea And Vomiting  . Lisinopril Shortness Of Breath    Other reaction(s): Cough   . Vancomycin Itching  . Versed [Midazolam] Anxiety  . Azithromycin     IV only  . Cellcept [Mycophenolate] Diarrhea  . Dobutamine     Other reaction(s): HTN, bradycardia  . Ibuprofen     Other reaction(s): *Unknown Pt was told not to take ibuprofen after kidney transplant - Sep 10, 2003    Immunization History  Administered Date(s) Administered  . Influenza Inj Mdck Quad Pf 01/22/2017, 12/30/2019  . Influenza Split 01/27/2015, 01/22/2017  . Influenza, Seasonal, Injecte, Preservative Fre 01/27/2015  . Influenza,inj,Quad PF,6+ Mos 01/24/2015, 03/11/2018  . Influenza,inj,Quad PF,6-35 Mos 03/13/2016  . Influenza,inj,quad, With Preservative 03/12/2016  . Moderna Sars-Covid-2 Vaccination 01/01/2020  . PFIZER(Purple Top)SARS-COV-2 Vaccination 07/15/2019, 08/15/2019  . Tdap 08/08/2016    Past Medical History:  Diagnosis Date  . Blood transfusion without reported diagnosis   . Cancer ([place])   . Complication of anesthesia    Versed - prolonged extreme anxiety attack  . Family history of adverse reaction to anesthesia    mother's father  . Neurofibromatosis ([place])   . Renal disorder   . Renal transplant, status post 2005    Tobacco History: Social History   Tobacco Use  Smoking Status Never Smoker  Smokeless Tobacco Never Used   Counseling given: Not Answered   Outpatient Medications Prior to Visit  Medication Sig Dispense Refill  . acetaminophen (TYLENOL) 500 MG tablet Take 1,000 mg by mouth every 8 (eight) hours as needed for fever or moderate pain.     Marland Kitchen acyclovir (ZOVIRAX) 400 MG tablet Take 400 mg by mouth daily.    Marland Kitchen albuterol (VENTOLIN HFA) 108 (90 Base) MCG/ACT inhaler Inhale 2 puffs into the lungs every 4 (four) hours as needed for wheezing.    Marland Kitchen amLODipine (NORVASC) 10 MG tablet Take 10 mg by mouth daily.    Marland Kitchen buPROPion (WELLBUTRIN) 100 MG tablet Take 100 mg by mouth daily.    . calcium carbonate (OS-CAL) 1250 (500 Ca) MG chewable tablet Chew 2 tablets by mouth daily as needed for heartburn.    . diphenhydrAMINE (BENADRYL) 25 MG tablet Take 25 mg by mouth daily as needed for itching or allergies.    Marland Kitchen escitalopram (LEXAPRO) 20 MG tablet Take 20 mg by mouth daily.    . famotidine (PEPCID) 40 MG tablet Take 40 mg by mouth daily.    Marland Kitchen gabapentin (NEURONTIN) 300 MG capsule Take 300 mg by mouth See admin instructions. Takes only on Dialysis days ( Monday, Wednesday ana Friday)    . Lactobacillus Rhamnosus, GG, (CULTURELLE) CAPS Take 1 capsule by mouth daily at 6 (six) AM.    . losartan (COZAAR) 50 MG tablet Take 50 mg by mouth daily.    . melatonin 5 MG TABS Take 5 mg by mouth  at bedtime.    . methylphenidate 18 MG PO CR tablet Take 18 mg by mouth daily. Takes only Monday through Friday.    . montelukast (SINGULAIR) 10 MG tablet Take 1 tablet (10 mg total) by mouth at bedtime. 30 tablet 5  . multivitamin (RENA-VIT) TABS tablet Take 1 tablet by mouth daily.    . norethindrone (AYGESTIN) 5 MG tablet Take 5 mg by mouth daily.    . ondansetron (ZOFRAN) 4 MG tablet Take 4 mg by mouth every 8 (eight) hours as needed for nausea or vomiting.    . predniSONE (DELTASONE) 5 MG tablet Take 5 mg by mouth daily.    . sevelamer (RENAGEL) 800 MG tablet Take 1,600 mg by mouth 3 (three) times daily.    . simvastatin (ZOCOR) 10 MG tablet Take 10 mg by mouth daily.    . sirolimus (RAPAMUNE) 1 MG tablet Take 2 mg by mouth daily.    Marland Kitchen SPIRIVA RESPIMAT 1.25 MCG/ACT AERS 2 puffs daily.    . SUMAtriptan (IMITREX) 50 MG tablet Take 50 mg by mouth daily as needed  for migraine.     No facility-administered medications prior to visit.   Review of Systems  Review of Systems  Constitutional: Negative.   HENT: Negative.   Respiratory: Negative for apnea, cough, shortness of breath and wheezing.   Cardiovascular: Positive for leg swelling.   Physical Exam  BP 128/80 (BP Location: Right Arm, Patient Position: Sitting, Cuff Size: Normal)   Pulse (!) 105   Temp 97.8 F (36.6 C) (Temporal)   Ht _0  (1.575 m)   Wt 154 lb 5.2 oz (70 kg)   SpO2 97%   BMI 28.23 kg/m  Physical Exam Constitutional:      Schwartz: She is not in acute distress.    Appearance: Normal appearance. She is not ill-appearing.  HENT:     Head: Normocephalic and atraumatic.     Mouth/Throat:     Mouth: Mucous membranes are moist.     Pharynx: Oropharynx is clear.  Cardiovascular:     Rate and Rhythm: Normal rate and regular rhythm.     Comments: Trace BLE leg swelling Pulmonary:     Effort: Pulmonary effort is normal.     Breath sounds: Normal breath sounds. No wheezing, rhonchi or rales.  Musculoskeletal:        Schwartz: Normal range of motion.  Skin:    Schwartz: Skin is warm and dry.  Neurological:     Schwartz: No focal deficit present.     Mental Status: She is alert and oriented to person, place, and time. Mental status is at baseline.  Psychiatric:        Mood and Affect: Mood normal.        Behavior: Behavior normal.        Thought Content: Thought content normal.        Judgment: Judgment normal.      Lab Results:  CBC    Component Value Date/Time   WBC 8.9 07/15/2020 0945   RBC 2.57 (L) 07/15/2020 0945   HGB 7.4 (L) 07/15/2020 0945   HCT 23.3 (L) 07/15/2020 0945   PLT 420 (H) 07/15/2020 0945   MCV 90.7 07/15/2020 0945   MCH 28.8 07/15/2020 0945   MCHC 31.8 07/15/2020 0945   RDW 12.5 07/15/2020 0945   LYMPHSABS 1.3 07/07/2020 1411   MONOABS 0.8 07/07/2020 1411   EOSABS 0.2 07/07/2020 1411   BASOSABS 0.1 07/07/2020 1411    BMET  Component Value Date/Time   NA 137 07/15/2020 0945   K 3.4 (L) 07/15/2020 0945   CL 102 07/15/2020 0945   CO2 21 (L) 07/15/2020 0945   GLUCOSE 87 07/15/2020 0945   BUN 38 (H) 07/15/2020 0945   CREATININE 9.18 (H) 07/15/2020 0945   CALCIUM 8.6 (L) 07/15/2020 0945   GFRNONAA 5 (L) 07/15/2020 0945    BNP No results found for: BNP  ProBNP No results found for: PROBNP  Imaging: DG Chest 2 View  Result Date: 09/20/2020 CLINICAL DATA:  Asthma. EXAM: CHEST - 2 VIEW COMPARISON:  August 23, 2020. FINDINGS: The heart size and mediastinal contours are within normal limits. No pneumothorax or pleural effusion is noted. Left lung is clear. Small ill-defined opacity seen in the right midlung which may represent residual inflammation or scarring. The visualized skeletal structures are unremarkable. IMPRESSION: Small ill-defined opacity seen in the right midlung which may represent residual inflammation or possibly scarring. Electronically Signed   By: Marijo Conception M.D.   On: 09/20/2020 11:08   DG Chest 2 View  Result Date: 08/23/2020 CLINICAL DATA:  Pneumonia EXAM: CHEST - 2 VIEW COMPARISON:  July 25, 2020. FINDINGS: There has been partial but incomplete clearing of ill-defined airspace opacity throughout much of the left lung. There is also been partial but incomplete clearing of infiltrate from the right lower lung region. No new areas of airspace opacity present. Heart size and pulmonary vascularity are normal. No adenopathy. No bone lesions. IMPRESSION: Partial but incomplete clearing of infiltrate bilaterally with ill-defined patchy areas of airspace opacity scattered throughout the left lung in the right lower lung region. No new opacity evident. Heart size normal. No adenopathy evident. Electronically Signed   By: Lowella Grip III M.D.   On: 08/23/2020 12:21     Assessment & Plan:   Multifocal pneumonia - Multifocal pneumonia in March 2022. Clinically resolved. She is no longer  requiring supplemental oxygen. She has no acute symptoms.  - CXR today showed small ill defined right middle lobe opacity likely residual inflammation or scarring  - She will need repeat CT chest in fall  Allergic rhinitis - Allergic rhinitis with asthma  - Maintained on Spiriva Respimat 1.55mg, singulair 146mqhs and prn albuterol for allergic rhinitis with asthma  - Needs PFTs in the future for baseline comparison   End stage renal disease ([facility]- ESRD with hx renal transplant, [facility] Sh[facility]udwig [facility]ith [facility] be [facility] back on transplant list    Nocturnal oxygen desaturation - Per apple watch monitor her O2 range is between 92-97% on roomair at night with averge 94%  - Consider HST in the future d/t snoring, do not overtly suspect sleep apnea   > 40 mins spent   ElMartyn EhrichNP 09/20/2020

## 2020-09-20 NOTE — Assessment & Plan Note (Addendum)
-   Per apple watch monitor her O2 range is between 92-97% on roomair at night with averge 94%  - Consider HST in the future d/t snoring, do not overtly suspect sleep apnea

## 2020-09-20 NOTE — Assessment & Plan Note (Addendum)
-   Multifocal pneumonia in March 2022. Clinically resolved. She is no longer requiring supplemental oxygen. She has no acute symptoms.  - CXR today showed small ill defined right middle lobe opacity likely residual inflammation or scarring  - She will need repeat CT chest in fall

## 2020-09-20 NOTE — Patient Instructions (Addendum)
CXR showed small ill defined opacity right middle lobe is is likely residual inflammation or scarring  Your oxygen level stayed above 95% on walk test today  We are ok with clearing you to get back on transplant list   Follow-up  8 weeks with Dr. Halford Chessman

## 2020-09-20 NOTE — Progress Notes (Signed)
Reviewed and agree with assessment/plan.   Chesley Mires, MD [facility] Pulmonary/Critical Care 09/20/2020, 12:42 PM Pager:  [phone]

## 2020-09-20 NOTE — Assessment & Plan Note (Addendum)
-   Allergic rhinitis with asthma  - Maintained on Spiriva Respimat 1.46mcg, singulair 10mg  qhs and prn albuterol for allergic rhinitis with asthma  - Needs PFTs in the future for baseline comparison

## 2020-09-20 NOTE — Assessment & Plan Note (Addendum)
-   ESRD with hx renal transplant, followed by Dr. Ludwig Clarks with nephrology at [facility] by pulmonary to be placed back on transplant list

## 2020-10-25 ENCOUNTER — Ambulatory Visit: Payer: Managed Care, Other (non HMO) | Admitting: Pulmonary Disease

## 2020-11-15 ENCOUNTER — Ambulatory Visit (INDEPENDENT_AMBULATORY_CARE_PROVIDER_SITE_OTHER): Payer: Managed Care, Other (non HMO) | Admitting: Pulmonary Disease

## 2020-11-15 ENCOUNTER — Other Ambulatory Visit: Payer: Self-pay

## 2020-11-15 ENCOUNTER — Encounter: Payer: Self-pay | Admitting: Pulmonary Disease

## 2020-11-15 VITALS — BP 112/76 | HR 94 | Temp 98.7°F | Ht 62.0 in | Wt 155.2 lb

## 2020-11-15 DIAGNOSIS — J4530 Mild persistent asthma, uncomplicated: Secondary | ICD-10-CM | POA: Diagnosis not present

## 2020-11-15 DIAGNOSIS — R0683 Snoring: Secondary | ICD-10-CM | POA: Diagnosis not present

## 2020-11-15 DIAGNOSIS — J849 Interstitial pulmonary disease, unspecified: Secondary | ICD-10-CM

## 2020-11-15 NOTE — Patient Instructions (Signed)
Will schedule pulmonary function test and home sleep study  Will schedule high resolution CT chest for October 2022 and try to coordinate this to be done after a dialysis session  Try stopping spiriva  Follow up in 3 months

## 2020-11-15 NOTE — Progress Notes (Signed)
Kaitlyn Schwartz, Kaitlyn Schwartz, Kaitlyn Schwartz  Chief Complaint  Patient presents with   Follow-up    Patient reports she had pneumonia, She had a CT at [facility]. She is much better.      Constitutional:  BP 112/76 (BP Location: Right Arm, Patient Position: Sitting, Cuff Size: Normal)   Pulse 94   Temp 98.7 F (37.1 C) (Oral)   Ht 5\' 2"  (1.575 m)   Wt 155 lb 3.2 oz (70.4 kg)   SpO2 97%   BMI 28.39 kg/m   Past Medical History:  Neurofibromatosis, s/p Renal transplant 2005, HTN, HLD, CMV, Post transplant lymphoproliferative disorder  Past Surgical History:  She  has a past surgical history that includes Kidney transplant; throat biopsy; Tonsillectomy; Video bronchoscopy (Bilateral, 07/09/2020); Bronchial washings (07/09/2020); Kaitlyn Bronchial brushings (07/09/2020).  Brief Summary:  Kaitlyn Schwartz is a 35 y.o. female with hypoxia in setting of pneumonia, allergic asthma Kaitlyn rhinitis, Kaitlyn snoring.      Subjective:   She is here with her mother.   She saw Geraldo Pitter in May.  No longer needing supplemental oxygen.    She is now listed for renal transplant again.  She had CT chest on 11/01/20 as part of transplant assessment.  Showed areas of GGO.  This was done on day between dialysis session.  She is not having cough, wheeze, sputum, fever, hemoptysis, chest pain, or dyspnea.  She still snores Kaitlyn family members have mentioned that she gets shallow breathing at night.  Physical Exam:   Appearance - well kempt   ENMT - no sinus tenderness, no oral exudate, no LAN, Mallampati 3 airway, no stridor  Respiratory - equal breath sounds bilaterally, no wheezing or rales  CV - s1s2 regular rate Kaitlyn rhythm, no murmurs  Ext - no clubbing, no edema  Skin - no rashes  Psych - normal mood Kaitlyn affect   Schwartz testing:  Bronchoscopy 3/12 >> 225 WBC (78%lymphocytes)  Chest Imaging:  CT angio chest 07/07/20 >> extensive patchy GGO, small b/l effusions, scoliosis CT  chest 11/01/20 >> scattered GGO b/l lower lobes Kaitlyn lingula  Sleep Tests:    Social History:  She  reports that she has never smoked. She has never used smokeless tobacco. She reports previous alcohol use. She reports that she does not use drugs.  Family History:  Her family history includes Cancer in her father Kaitlyn mother; Hypertension in her father Kaitlyn mother.     Assessment/Plan:   Schwartz infiltrates. - from March 2022 Kaitlyn felt to be pneumonia - clinically improved Kaitlyn transitioned off supplemental oxygen - CT chest from 11/01/20 showed areas of ground glass, but she doesn't have any respiratory symptoms; might be related to edema in between dialysis days - will need to schedule high resolution CT chest in October 2022 to monitor for development of ILD  Snoring. - she has sleep disruption, apnea, Kaitlyn daytime sleepiness - she has history of hypertension - will arrange for home sleep study to assess further  Allergic rhinitis with asthma. - she has allergy to tree pollen with Spring being worse time of year - continue singulair - will have her try stopping spiriva - prn albuterol - will arrange for PFT   ESRD with hx of renal transplant. - followed by Dr. Ludwig Clarks with nephrology at [facility] - has been listed repeat transplant  Time Spent Involved in Patient Schwartz on Day of Examination:  35 minutes  Follow up:   Patient Instructions  Will  schedule Schwartz function test Kaitlyn home sleep study  Will schedule high resolution CT chest for October 2022 Kaitlyn try to coordinate this to be done after a dialysis session  Try stopping spiriva  Follow up in 3 months  Medication List:   Allergies as of 11/15/2020       Reactions   Augmentin [amoxicillin-pot Clavulanate] Diarrhea, Nausea Kaitlyn Vomiting   Lisinopril Shortness Of Breath   Other reaction(s): Cough   Vancomycin Itching   Versed [midazolam] Anxiety   Azithromycin    IV only   Cellcept [mycophenolate]  Diarrhea   Dobutamine    Other reaction(s): HTN, bradycardia   Ibuprofen    Other reaction(s): *Unknown Pt was told not to take ibuprofen after kidney transplant - Sep 10, 2003        Medication List        Accurate as of November 15, 2020 11:12 AM. If you have any questions, ask your nurse or doctor.          STOP taking these medications    Spiriva Respimat 1.25 MCG/ACT Aers Generic drug: Tiotropium Bromide Monohydrate Stopped by: Chesley Mires, MD       TAKE these medications    acetaminophen 500 MG tablet Commonly known as: TYLENOL Take 1,000 mg by mouth every 8 (eight) hours as needed for fever or moderate pain.   acyclovir 400 MG tablet Commonly known as: ZOVIRAX Take 400 mg by mouth daily.   albuterol 108 (90 Base) MCG/ACT inhaler Commonly known as: VENTOLIN HFA Inhale 2 puffs into the lungs every 4 (four) hours as needed for wheezing.   amLODipine 10 MG tablet Commonly known as: NORVASC Take 10 mg by mouth daily.   buPROPion 100 MG tablet Commonly known as: WELLBUTRIN Take 100 mg by mouth daily.   calcium carbonate 1250 (500 Ca) MG chewable tablet Commonly known as: OS-CAL Chew 2 tablets by mouth daily as needed for heartburn.   Culturelle Caps Take 1 capsule by mouth daily at 6 (six) AM.   diphenhydrAMINE 25 MG tablet Commonly known as: BENADRYL Take 25 mg by mouth daily as needed for itching or allergies.   escitalopram 20 MG tablet Commonly known as: LEXAPRO Take 20 mg by mouth daily.   famotidine 40 MG tablet Commonly known as: PEPCID Take 40 mg by mouth daily.   gabapentin 300 MG capsule Commonly known as: NEURONTIN Take 300 mg by mouth See admin instructions. Takes only on Dialysis days ( Monday, Wednesday ana Friday)   losartan 50 MG tablet Commonly known as: COZAAR Take 50 mg by mouth daily.   melatonin 5 MG Tabs Take 5 mg by mouth at bedtime.   methylphenidate 18 MG CR tablet Commonly known as: CONCERTA Take 18 mg by mouth  daily. Takes only Monday through Friday.   montelukast 10 MG tablet Commonly known as: SINGULAIR Take 1 tablet (10 mg total) by mouth at bedtime.   multivitamin Tabs tablet Take 1 tablet by mouth daily.   norethindrone 5 MG tablet Commonly known as: AYGESTIN Take 5 mg by mouth daily.   ondansetron 4 MG tablet Commonly known as: ZOFRAN Take 4 mg by mouth every 8 (eight) hours as needed for nausea or vomiting.   predniSONE 5 MG tablet Commonly known as: DELTASONE Take 5 mg by mouth daily.   sevelamer 800 MG tablet Commonly known as: RENAGEL Take 1,600 mg by mouth 3 (three) times daily.   simvastatin 10 MG tablet Commonly known as: ZOCOR Take 10 mg  by mouth daily.   sirolimus 1 MG tablet Commonly known as: RAPAMUNE Take 2 mg by mouth daily.   SUMAtriptan 50 MG tablet Commonly known as: IMITREX Take 50 mg by mouth daily as needed for migraine.        Signature:  Chesley Mires, MD [place] Pager - [phone] 11/15/2020, 11:12 AM

## 2021-02-21 ENCOUNTER — Other Ambulatory Visit: Payer: Self-pay | Admitting: Pulmonary Disease

## 2021-06-20 ENCOUNTER — Other Ambulatory Visit: Payer: Self-pay | Admitting: Pulmonary Disease

## 2021-07-16 IMAGING — DX DG CHEST 2V
2 series · 2 of 2 positions shown · non-contrast
Comparison: 07/16/2020, CT 07/07/2020, 07/13/2020, 07/07/2020,
07/03/2020

CLINICAL DATA: Follow-up pneumonia

EXAM:
CHEST - 2 VIEW

[chest pa]
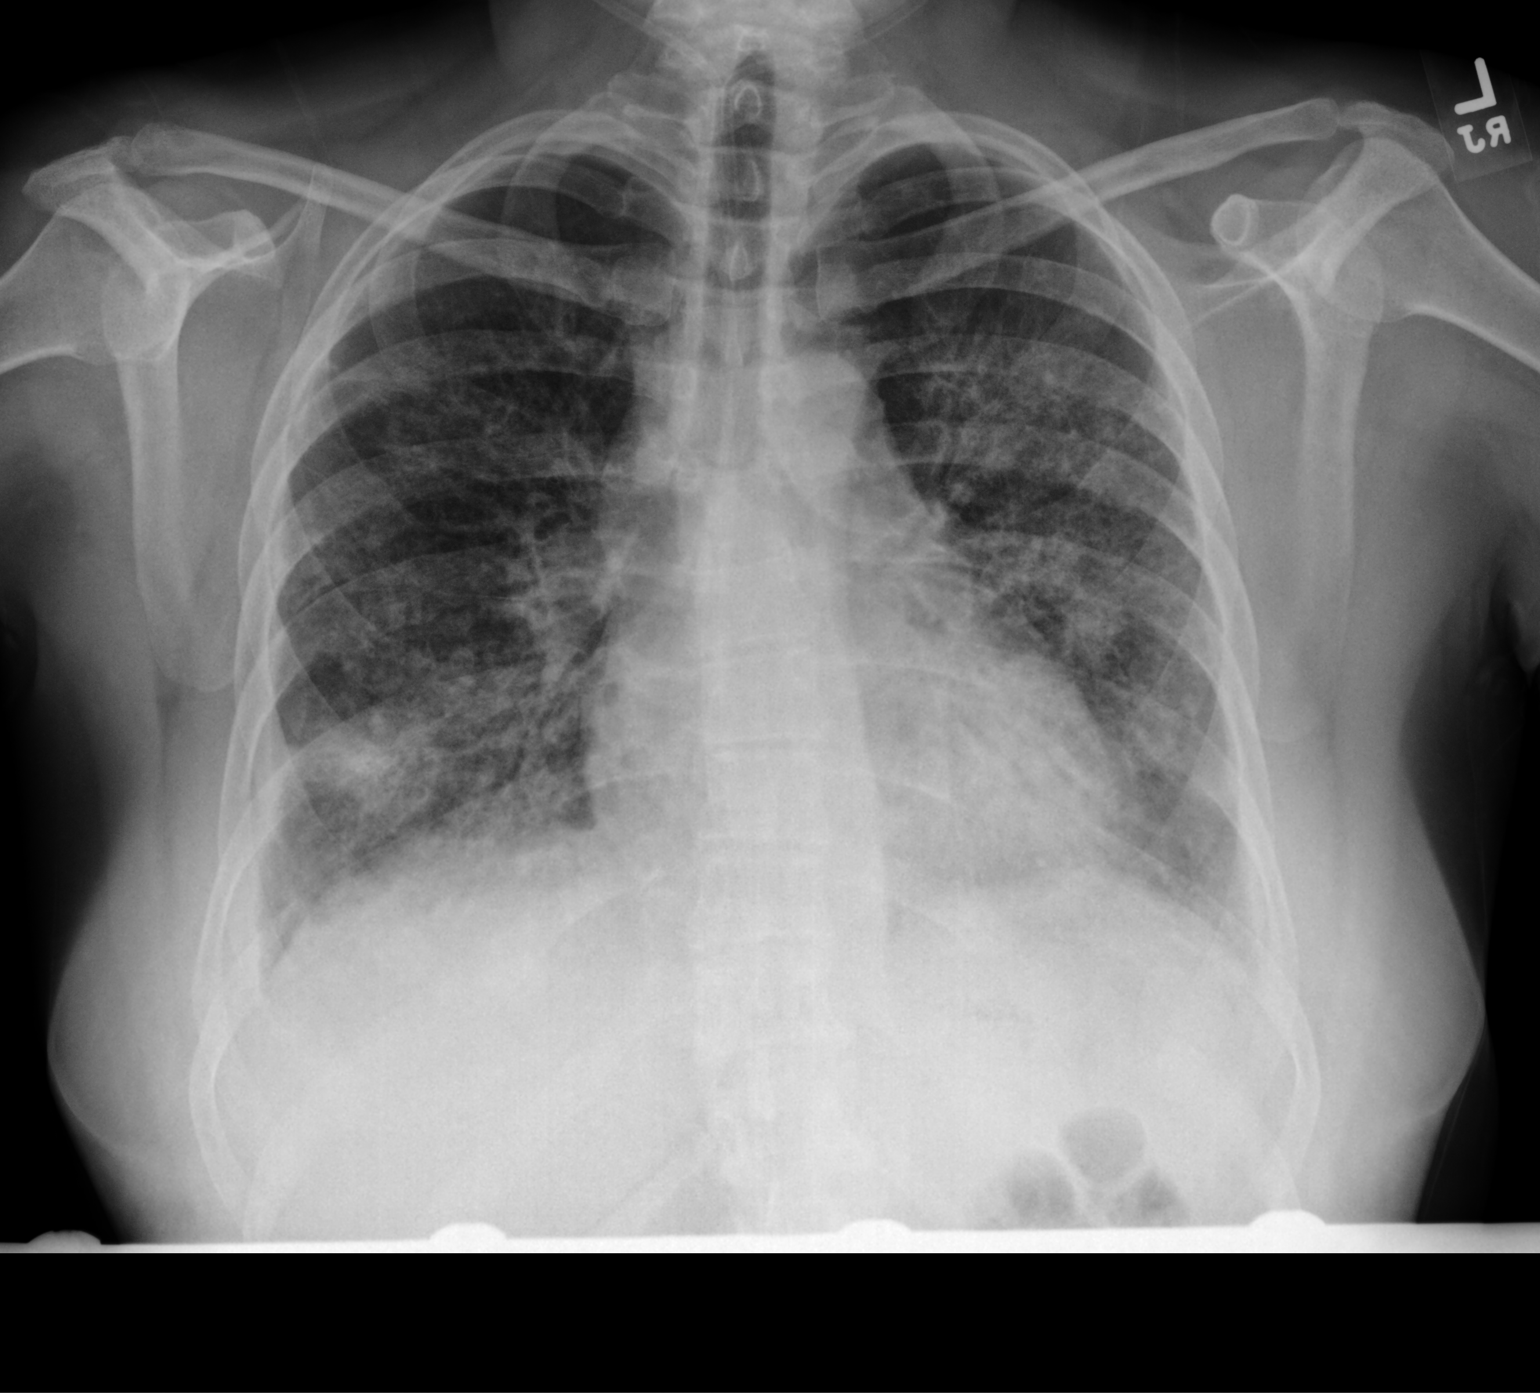

[chest lat]
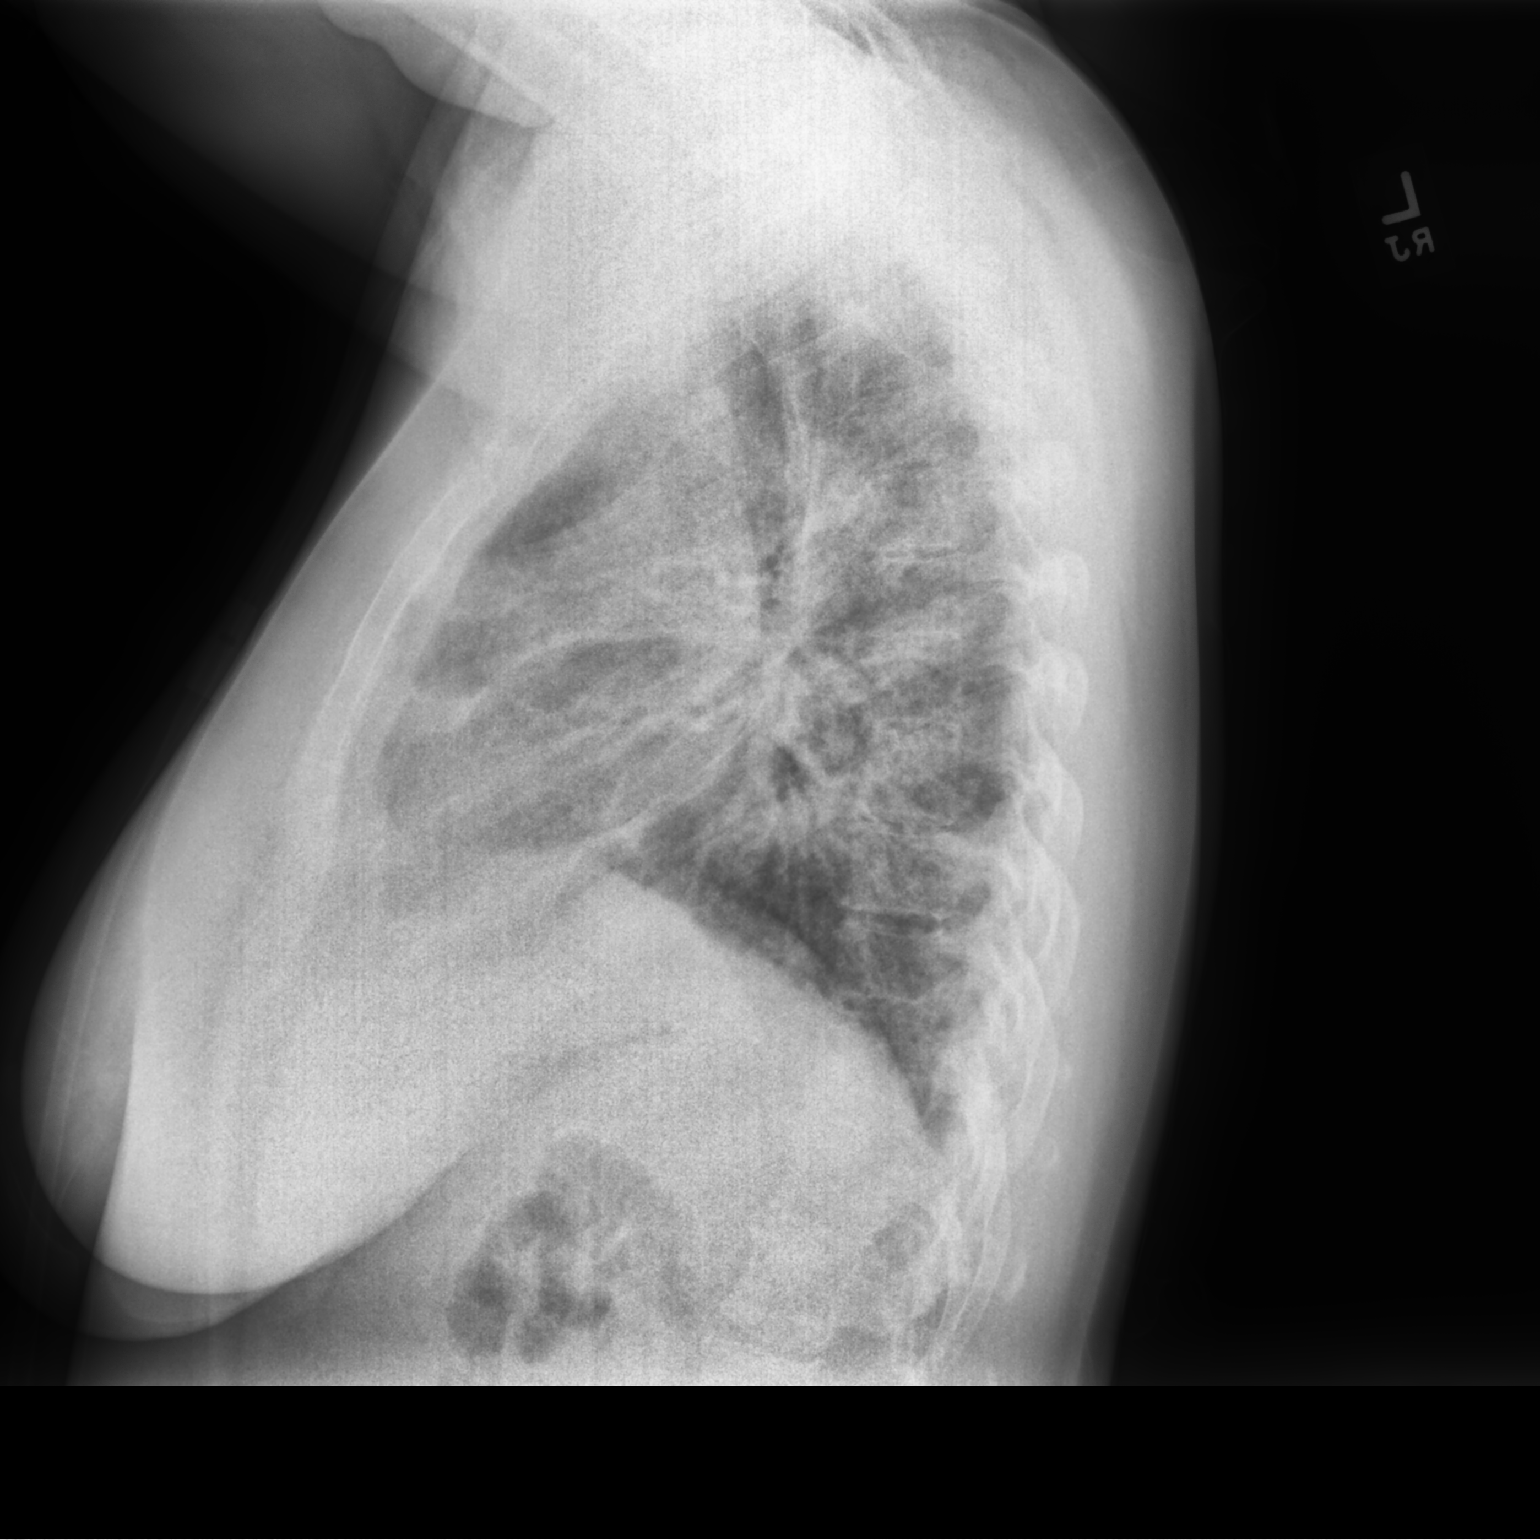

[2 of 2 positions shown; findings below may reference images not displayed]

FINDINGS: Small bilateral pleural effusions or pleural thickening. Slight
worsening of basilar airspace disease compared to most recent prior,
remaining pulmonary infiltrates are probably otherwise grossly
unchanged. Cardiomediastinal silhouette is stable. No pneumothorax.
IMPRESSION: Slight worsening of basilar airspace disease compared to most recent
prior. Remaining pulmonary infiltrates are probably otherwise
grossly unchanged. Small bilateral pleural effusions or pleural
thickening

## 2021-08-02 ENCOUNTER — Telehealth: Payer: Self-pay | Admitting: Pulmonary Disease

## 2021-08-02 DIAGNOSIS — J4540 Moderate persistent asthma, uncomplicated: Secondary | ICD-10-CM

## 2021-08-02 NOTE — Telephone Encounter (Signed)
Okay to have home oxygen set up discontinued. ?

## 2021-08-02 NOTE — Telephone Encounter (Signed)
Dr. Halford Chessman,  ?It looks like the patient did a walk test on 09/20/20 and did not require oxygen.  Per your note on 11/15/20, states patient is no longer oxygen dependent.  Patient wants to have oxygen removed from her home.  Please advise if ok to send order to DME company.  Thank you. ? ? ?Assessment/Plan:  ?  ?Pulmonary infiltrates. ?- from March 2022 and felt to be pneumonia ?- clinically improved and transitioned off supplemental oxygen ?- CT chest from 11/01/20 showed areas of ground glass, but she doesn't have any respiratory symptoms; might be related to edema in between dialysis days ?- will need to schedule high resolution CT chest in October 2022 to monitor for development of ILD ?  ?Snoring. ?- she has sleep disruption, apnea, and daytime sleepiness ?- she has history of hypertension ?- will arrange for home sleep study to assess further ?  ?Allergic rhinitis with asthma. ?- she has allergy to tree pollen with Spring being worse time of year ?- continue singulair ?- will have her try stopping spiriva ?- prn albuterol ?- will arrange for PFT ?  ?ESRD with hx of renal transplant. ?- followed by Dr. Ludwig Clarks with nephrology at [facility] ?- has been listed repeat transplant ?  ?Time Spent Involved in Patient Care on Day of Examination:  ?35 minutes ?  ?Follow up:  ?  ?Patient Instructions  ?Will schedule pulmonary function test and home sleep study ?  ?Will schedule high resolution CT chest for October 2022 and try to coordinate this to be done after a dialysis session ?  ?Try stopping spiriva ?  ?Follow up in 3 months ?  ?Medication List:  ?  ?Allergies as of 11/15/2020   ?  ?    Reactions  ?  Augmentin [amoxicillin-pot Clavulanate] Diarrhea, Nausea And Vomiting  ?  Lisinopril Shortness Of Breath  ?  Other reaction(s): Cough  ?  Vancomycin Itching  ?  Versed [midazolam] Anxiety  ?  Azithromycin    ?  IV only  ?  Cellcept [mycophenolate] Diarrhea  ?  Dobutamine    ?  Other reaction(s): HTN, bradycardia  ?   Ibuprofen    ?  Other reaction(s): *Unknown ?Pt was told not to take ibuprofen after kidney transplant - Sep 10, 2003  ?  ?   ?  ?   ?Medication List  ?   ?  ?   ? Accurate as of November 15, 2020 11:12 AM. If you have any questions, ask your nurse or doctor.  ?  ?   ?  ?   ?  ?STOP taking these medications   ?  ?Spiriva Respimat 1.25 MCG/ACT Aers ?Generic drug: Tiotropium Bromide Monohydrate ?Stopped by: Chesley Mires, MD ?   ?  ?   ?  ?TAKE these medications   ?  ?acetaminophen 500 MG tablet ?Commonly known as: TYLENOL ?Take 1,000 mg by mouth every 8 (eight) hours as needed for fever or moderate pain. ?   ?acyclovir 400 MG tablet ?Commonly known as: ZOVIRAX ?Take 400 mg by mouth daily. ?   ?albuterol 108 (90 Base) MCG/ACT inhaler ?Commonly known as: VENTOLIN HFA ?Inhale 2 puffs into the lungs every 4 (four) hours as needed for wheezing. ?   ?amLODipine 10 MG tablet ?Commonly known as: NORVASC ?Take 10 mg by mouth daily. ?   ?buPROPion 100 MG tablet ?Commonly known as: WELLBUTRIN ?Take 100 mg by mouth daily. ?   ?calcium carbonate 1250 (500 Ca)  MG chewable tablet ?Commonly known as: OS-CAL ?Chew 2 tablets by mouth daily as needed for heartburn. ?   ?Culturelle Caps ?Take 1 capsule by mouth daily at 6 (six) AM. ?   ?diphenhydrAMINE 25 MG tablet ?Commonly known as: BENADRYL ?Take 25 mg by mouth daily as needed for itching or allergies. ?   ?escitalopram 20 MG tablet ?Commonly known as: LEXAPRO ?Take 20 mg by mouth daily. ?   ?famotidine 40 MG tablet ?Commonly known as: PEPCID ?Take 40 mg by mouth daily. ?   ?gabapentin 300 MG capsule ?Commonly known as: NEURONTIN ?Take 300 mg by mouth See admin instructions. Takes only on Dialysis days ( Monday, Wednesday ana Friday) ?   ?losartan 50 MG tablet ?Commonly known as: COZAAR ?Take 50 mg by mouth daily. ?   ?melatonin 5 MG Tabs ?Take 5 mg by mouth at bedtime. ?   ?methylphenidate 18 MG CR tablet ?Commonly known as: CONCERTA ?Take 18 mg by mouth daily. Takes only Monday through  Friday. ?   ?montelukast 10 MG tablet ?Commonly known as: SINGULAIR ?Take 1 tablet (10 mg total) by mouth at bedtime. ?   ?multivitamin Tabs tablet ?Take 1 tablet by mouth daily. ?   ?norethindrone 5 MG tablet ?Commonly known as: AYGESTIN ?Take 5 mg by mouth daily. ?   ?ondansetron 4 MG tablet ?Commonly known as: ZOFRAN ?Take 4 mg by mouth every 8 (eight) hours as needed for nausea or vomiting. ?   ?predniSONE 5 MG tablet ?Commonly known as: DELTASONE ?Take 5 mg by mouth daily. ?   ?sevelamer 800 MG tablet ?Commonly known as: RENAGEL ?Take 1,600 mg by mouth 3 (three) times daily. ?   ?simvastatin 10 MG tablet ?Commonly known as: ZOCOR ?Take 10 mg by mouth daily. ?   ?sirolimus 1 MG tablet ?Commonly known as: RAPAMUNE ?Take 2 mg by mouth daily. ?   ?SUMAtriptan 50 MG tablet ?Commonly known as: IMITREX ?Take 50 mg by mouth daily as needed for migraine. ?   ?  ?   ?  ?  ?Signature:  ?Chesley Mires, MD ?[place] ?Pager - [phone] ?11/15/2020, 11:12 AM ?   ?  ?  ?  ?  ?  ?  ?  ?   ?  ? ? Patient Instructions by Chesley Mires, MD at 11/15/2020 10:30 AM ? ?Author: Chesley Mires, MD Author Type: Physician Filed: 11/15/2020 11:07 AM  ?Note Status: Signed Cosign: Cosign Not Required Encounter Date: 11/15/2020  ?Editor: Chesley Mires, MD (Physician)      ?       ?  ?Will schedule pulmonary function test and home sleep study ?  ?Will schedule high resolution CT chest for October 2022 and try to coordinate this to be done after a dialysis session ?  ?Try stopping spiriva ?  ?Follow up in 3 months ?  ?  ? ? ?Orthostatic Vitals Recorded in This Encounter ? ? 11/15/2020  ?1039     ?Patient Position: Sitting  ?BP Location: Right Arm  ?Cuff Size: Normal  ? ?Instructions ? ? ? ?Return in about 3 months (around 02/15/2021). ? ?Will schedule pulmonary function test and home sleep study ?  ?Will schedule high resolution CT chest for October 2022 and try to coordinate this to be done after a dialysis session ?  ?Try  stopping spiriva ?  ?Follow up in 3 months  ?  ? ? ?

## 2021-08-02 NOTE — Telephone Encounter (Signed)
Called and spoke with patient, advised her that Dr. Halford Chessman said it was ok to discontinue her oxygen.  She verbalized understanding.  I verified her DME company as [employer].  Order sent to [employer]. ?

## 2021-08-14 IMAGING — DX DG CHEST 2V
2 series · 2 of 2 positions shown · non-contrast
Comparison: July 25, 2020.

CLINICAL DATA: Pneumonia

EXAM:
CHEST - 2 VIEW

[chest pa]
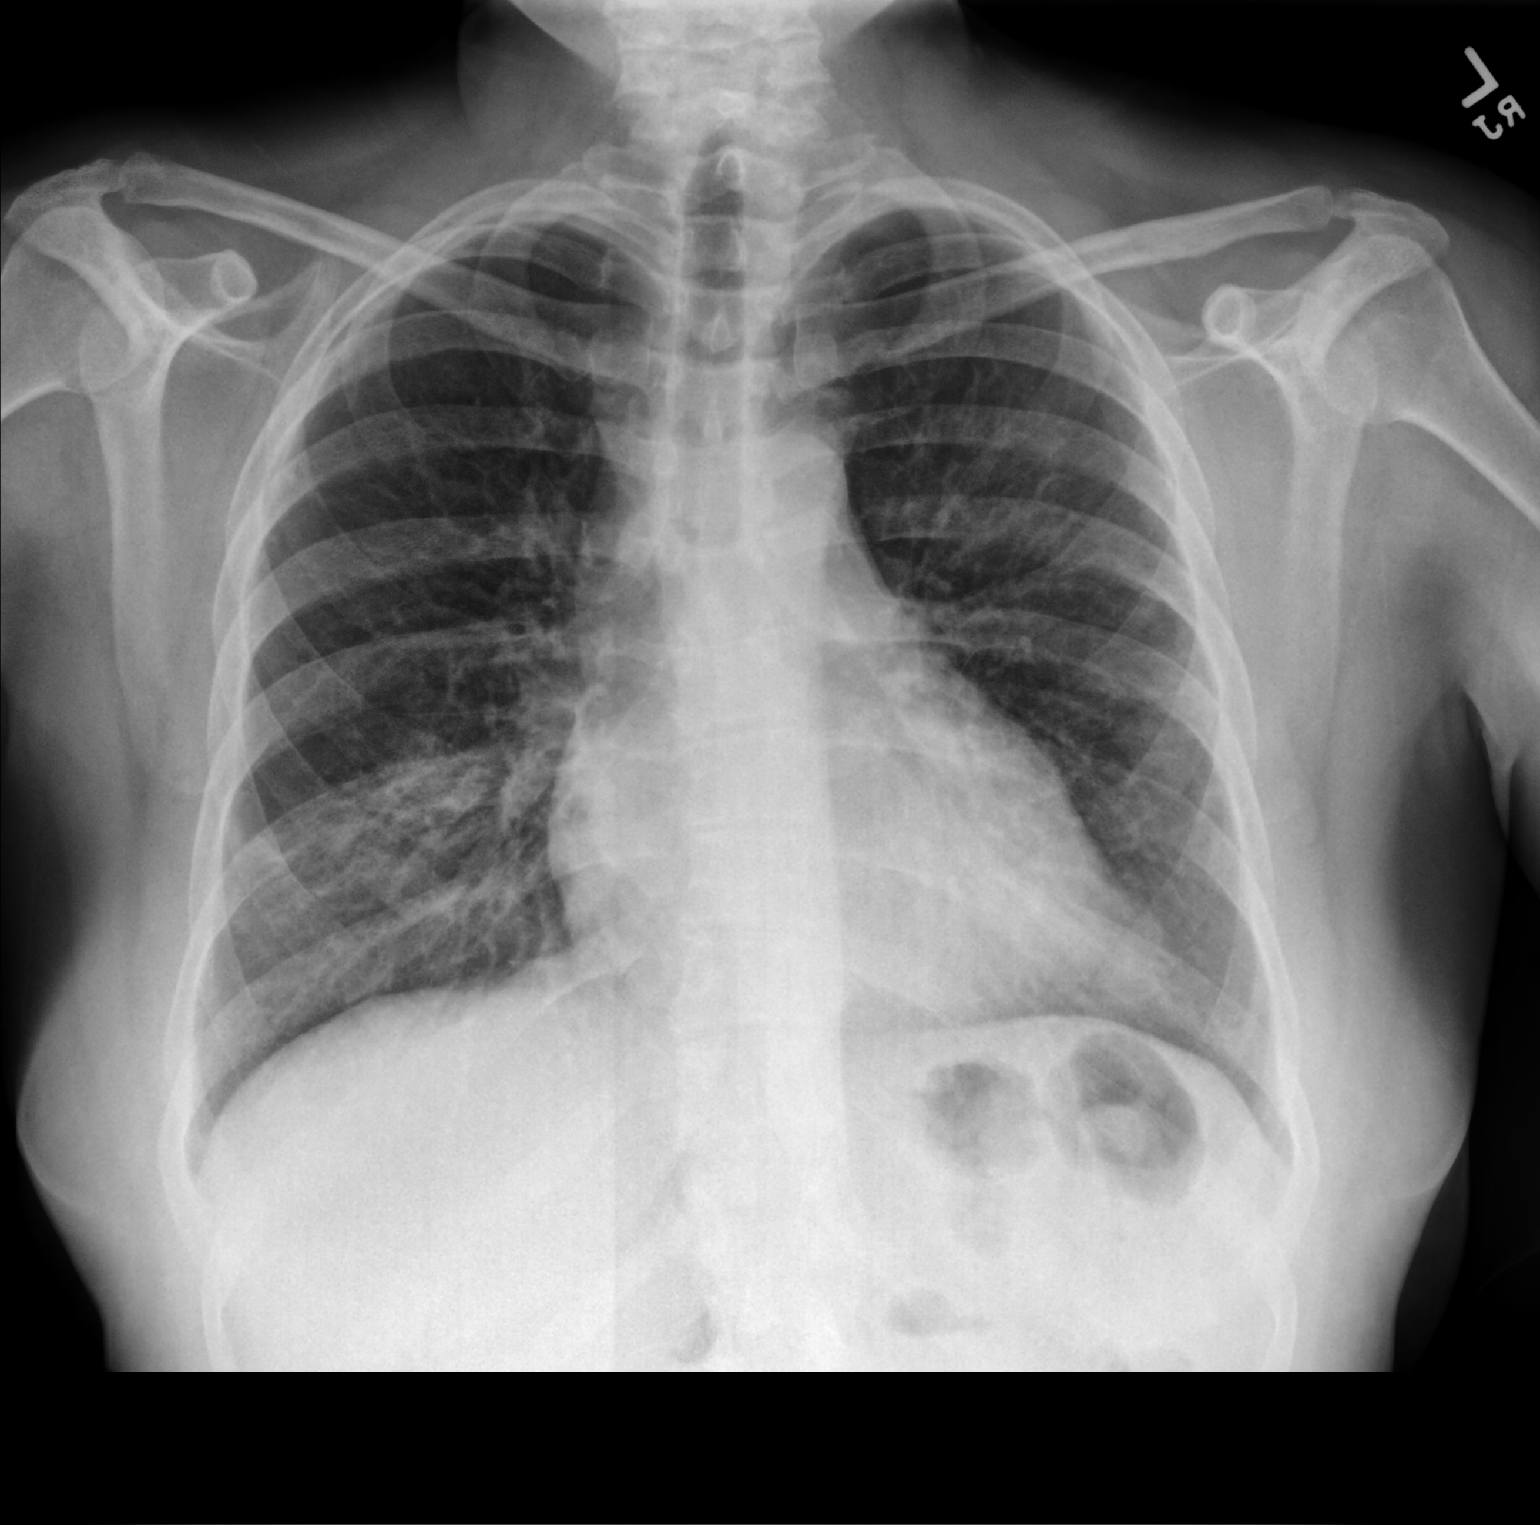

[chest lat]
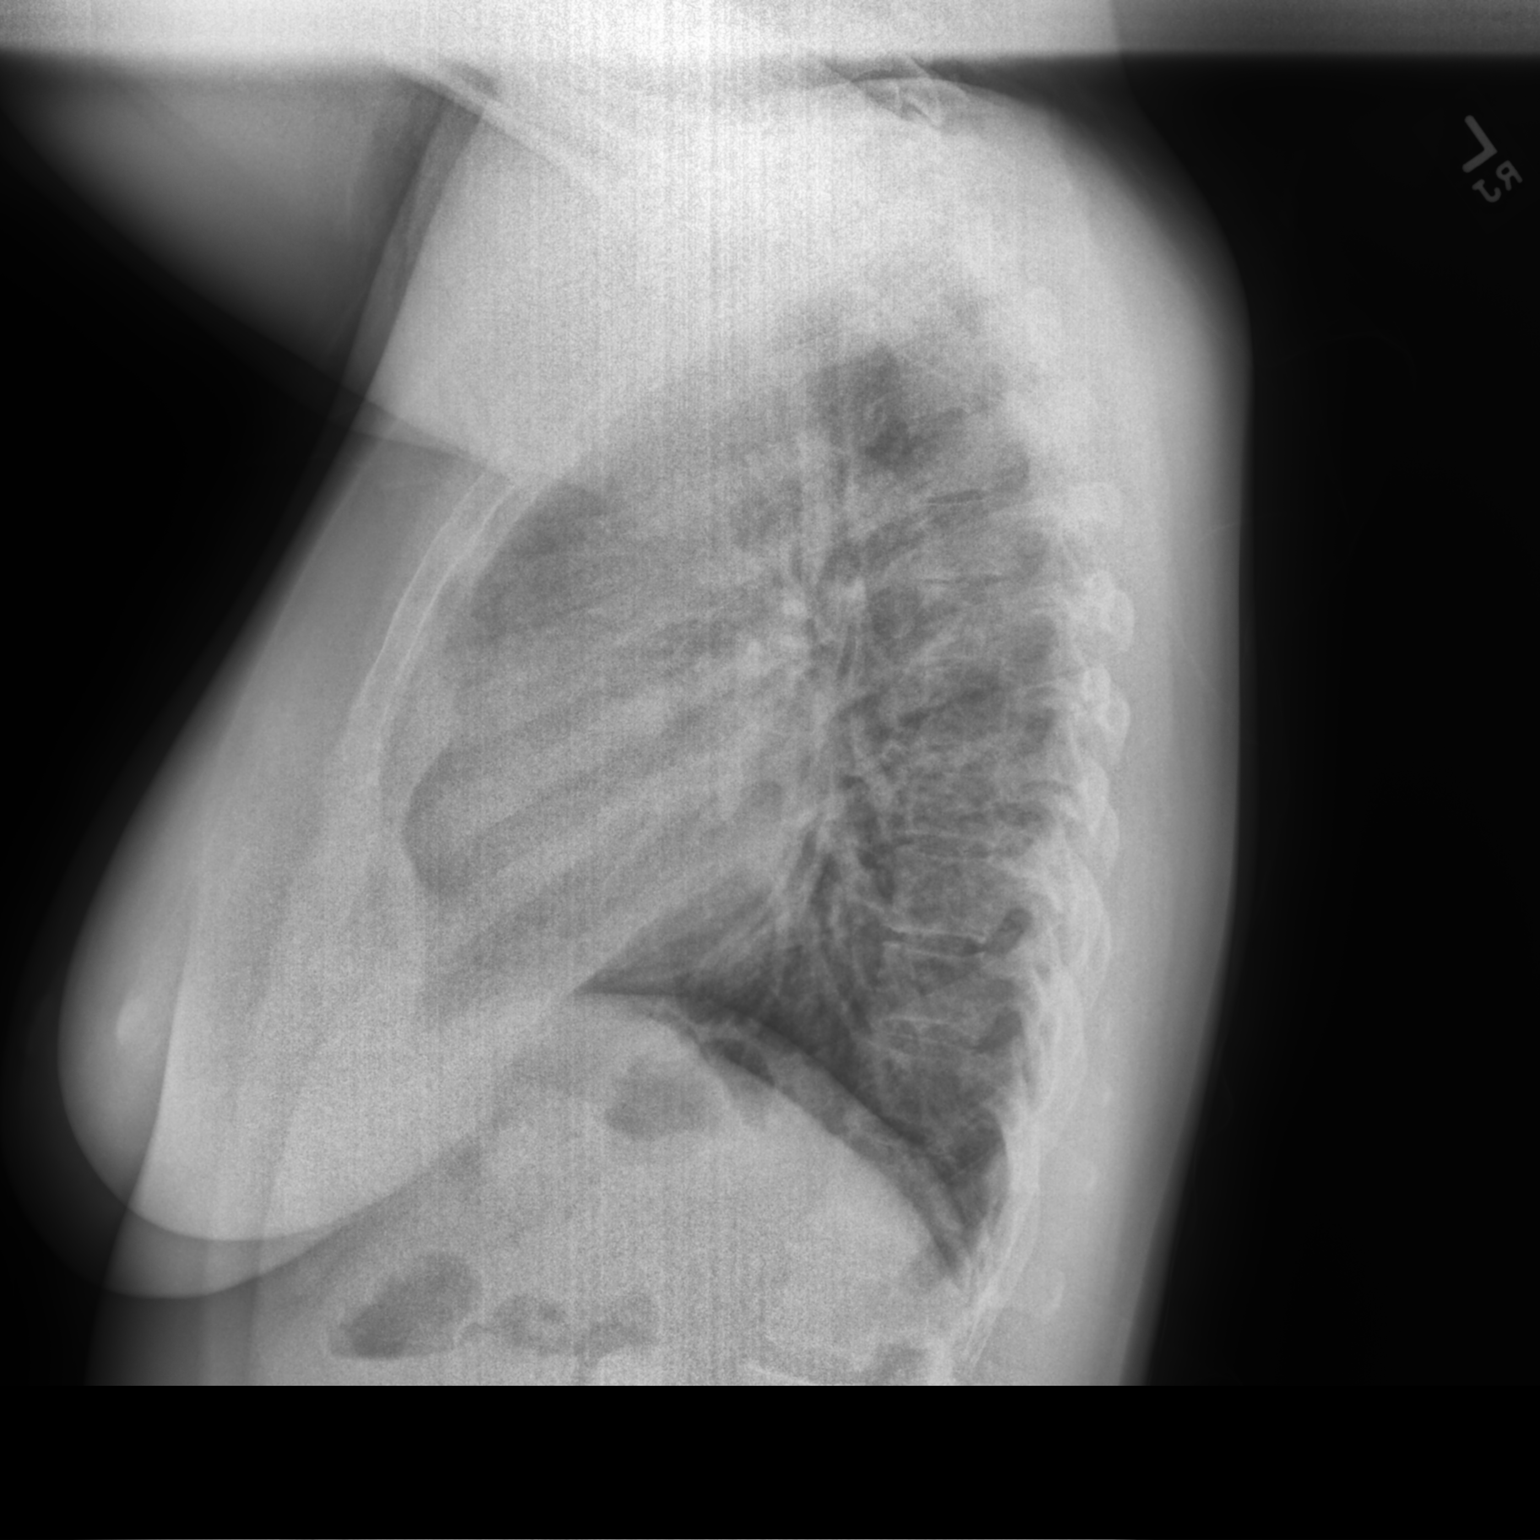

[2 of 2 positions shown; findings below may reference images not displayed]

FINDINGS: There has been partial but incomplete clearing of ill-defined
airspace opacity throughout much of the left lung. There is also
been partial but incomplete clearing of infiltrate from the right
lower lung region. No new areas of airspace opacity present. Heart
size and pulmonary vascularity are normal. No adenopathy. No bone
lesions.
IMPRESSION: Partial but incomplete clearing of infiltrate bilaterally with
ill-defined patchy areas of airspace opacity scattered throughout
the left lung in the right lower lung region. No new opacity
evident. Heart size normal. No adenopathy evident.

## 2022-11-27 ENCOUNTER — Telehealth: Payer: Self-pay | Admitting: Pulmonary Disease

## 2022-11-27 NOTE — Telephone Encounter (Signed)
Patient called to inform the doctor that she has covid and had double pneumonia before and wanted to know if there is something special  that she needs to do.  Please advise and call patient to discuss further.  CB# [phone]

## 2022-11-28 NOTE — Telephone Encounter (Signed)
ATC X1 LVM for patient to give the office a call back 

## 2022-11-29 NOTE — Telephone Encounter (Signed)
Spoke with the pt  She has been dx with covid  She states already taking antiviral  I asked that she call PCP or see UC if needed since we have not seen over in over 2 years  Pt verbalized understanding Nothing further needed

## 2023-05-16 DIAGNOSIS — F902 Attention-deficit hyperactivity disorder, combined type: Secondary | ICD-10-CM | POA: Diagnosis not present

## 2023-05-16 DIAGNOSIS — Z635 Disruption of family by separation and divorce: Secondary | ICD-10-CM | POA: Diagnosis not present

## 2023-05-16 DIAGNOSIS — F331 Major depressive disorder, recurrent, moderate: Secondary | ICD-10-CM | POA: Diagnosis not present

## 2023-05-30 DIAGNOSIS — Z635 Disruption of family by separation and divorce: Secondary | ICD-10-CM | POA: Diagnosis not present

## 2023-05-30 DIAGNOSIS — F331 Major depressive disorder, recurrent, moderate: Secondary | ICD-10-CM | POA: Diagnosis not present

## 2023-05-30 DIAGNOSIS — F902 Attention-deficit hyperactivity disorder, combined type: Secondary | ICD-10-CM | POA: Diagnosis not present

## 2023-06-06 DIAGNOSIS — R051 Acute cough: Secondary | ICD-10-CM | POA: Diagnosis not present

## 2023-06-06 DIAGNOSIS — R0981 Nasal congestion: Secondary | ICD-10-CM | POA: Diagnosis not present

## 2023-06-06 DIAGNOSIS — R52 Pain, unspecified: Secondary | ICD-10-CM | POA: Diagnosis not present

## 2023-06-06 DIAGNOSIS — J101 Influenza due to other identified influenza virus with other respiratory manifestations: Secondary | ICD-10-CM | POA: Diagnosis not present

## 2023-06-10 DIAGNOSIS — E785 Hyperlipidemia, unspecified: Secondary | ICD-10-CM | POA: Diagnosis not present

## 2023-06-10 DIAGNOSIS — F902 Attention-deficit hyperactivity disorder, combined type: Secondary | ICD-10-CM | POA: Diagnosis not present

## 2023-06-10 DIAGNOSIS — F418 Other specified anxiety disorders: Secondary | ICD-10-CM | POA: Diagnosis not present

## 2023-06-11 DIAGNOSIS — M9901 Segmental and somatic dysfunction of cervical region: Secondary | ICD-10-CM | POA: Diagnosis not present

## 2023-06-11 DIAGNOSIS — M5032 Other cervical disc degeneration, mid-cervical region, unspecified level: Secondary | ICD-10-CM | POA: Diagnosis not present

## 2023-06-11 DIAGNOSIS — M62830 Muscle spasm of back: Secondary | ICD-10-CM | POA: Diagnosis not present

## 2023-06-11 DIAGNOSIS — M9903 Segmental and somatic dysfunction of lumbar region: Secondary | ICD-10-CM | POA: Diagnosis not present

## 2023-06-12 DIAGNOSIS — R739 Hyperglycemia, unspecified: Secondary | ICD-10-CM | POA: Diagnosis not present

## 2023-06-12 DIAGNOSIS — D849 Immunodeficiency, unspecified: Secondary | ICD-10-CM | POA: Diagnosis not present

## 2023-06-12 DIAGNOSIS — N181 Chronic kidney disease, stage 1: Secondary | ICD-10-CM | POA: Diagnosis not present

## 2023-06-12 DIAGNOSIS — D84821 Immunodeficiency due to drugs: Secondary | ICD-10-CM | POA: Diagnosis not present

## 2023-06-12 DIAGNOSIS — R7303 Prediabetes: Secondary | ICD-10-CM | POA: Diagnosis not present

## 2023-06-12 DIAGNOSIS — Z940 Kidney transplant status: Secondary | ICD-10-CM | POA: Diagnosis not present

## 2023-06-20 DIAGNOSIS — F902 Attention-deficit hyperactivity disorder, combined type: Secondary | ICD-10-CM | POA: Diagnosis not present

## 2023-06-20 DIAGNOSIS — F331 Major depressive disorder, recurrent, moderate: Secondary | ICD-10-CM | POA: Diagnosis not present

## 2023-06-25 DIAGNOSIS — M62830 Muscle spasm of back: Secondary | ICD-10-CM | POA: Diagnosis not present

## 2023-06-25 DIAGNOSIS — M9903 Segmental and somatic dysfunction of lumbar region: Secondary | ICD-10-CM | POA: Diagnosis not present

## 2023-06-25 DIAGNOSIS — M5032 Other cervical disc degeneration, mid-cervical region, unspecified level: Secondary | ICD-10-CM | POA: Diagnosis not present

## 2023-06-25 DIAGNOSIS — M9901 Segmental and somatic dysfunction of cervical region: Secondary | ICD-10-CM | POA: Diagnosis not present

## 2023-06-27 DIAGNOSIS — F331 Major depressive disorder, recurrent, moderate: Secondary | ICD-10-CM | POA: Diagnosis not present

## 2023-06-27 DIAGNOSIS — F902 Attention-deficit hyperactivity disorder, combined type: Secondary | ICD-10-CM | POA: Diagnosis not present

## 2023-07-03 DIAGNOSIS — M5032 Other cervical disc degeneration, mid-cervical region, unspecified level: Secondary | ICD-10-CM | POA: Diagnosis not present

## 2023-07-03 DIAGNOSIS — M9903 Segmental and somatic dysfunction of lumbar region: Secondary | ICD-10-CM | POA: Diagnosis not present

## 2023-07-03 DIAGNOSIS — M9901 Segmental and somatic dysfunction of cervical region: Secondary | ICD-10-CM | POA: Diagnosis not present

## 2023-07-03 DIAGNOSIS — M62830 Muscle spasm of back: Secondary | ICD-10-CM | POA: Diagnosis not present

## 2023-07-04 DIAGNOSIS — F331 Major depressive disorder, recurrent, moderate: Secondary | ICD-10-CM | POA: Diagnosis not present

## 2023-07-04 DIAGNOSIS — F902 Attention-deficit hyperactivity disorder, combined type: Secondary | ICD-10-CM | POA: Diagnosis not present

## 2023-07-05 DIAGNOSIS — D509 Iron deficiency anemia, unspecified: Secondary | ICD-10-CM | POA: Diagnosis not present

## 2023-07-05 DIAGNOSIS — Z79621 Long term (current) use of calcineurin inhibitor: Secondary | ICD-10-CM | POA: Diagnosis not present

## 2023-07-05 DIAGNOSIS — Z79899 Other long term (current) drug therapy: Secondary | ICD-10-CM | POA: Diagnosis not present

## 2023-07-05 DIAGNOSIS — Z5181 Encounter for therapeutic drug level monitoring: Secondary | ICD-10-CM | POA: Diagnosis not present

## 2023-07-05 DIAGNOSIS — Z4822 Encounter for aftercare following kidney transplant: Secondary | ICD-10-CM | POA: Diagnosis not present

## 2023-07-05 DIAGNOSIS — Z940 Kidney transplant status: Secondary | ICD-10-CM | POA: Diagnosis not present

## 2023-07-05 DIAGNOSIS — D500 Iron deficiency anemia secondary to blood loss (chronic): Secondary | ICD-10-CM | POA: Diagnosis not present

## 2023-07-05 DIAGNOSIS — I10 Essential (primary) hypertension: Secondary | ICD-10-CM | POA: Diagnosis not present

## 2023-07-10 DIAGNOSIS — M5032 Other cervical disc degeneration, mid-cervical region, unspecified level: Secondary | ICD-10-CM | POA: Diagnosis not present

## 2023-07-10 DIAGNOSIS — M9901 Segmental and somatic dysfunction of cervical region: Secondary | ICD-10-CM | POA: Diagnosis not present

## 2023-07-10 DIAGNOSIS — M9903 Segmental and somatic dysfunction of lumbar region: Secondary | ICD-10-CM | POA: Diagnosis not present

## 2023-07-10 DIAGNOSIS — M62830 Muscle spasm of back: Secondary | ICD-10-CM | POA: Diagnosis not present

## 2023-07-11 DIAGNOSIS — F902 Attention-deficit hyperactivity disorder, combined type: Secondary | ICD-10-CM | POA: Diagnosis not present

## 2023-07-11 DIAGNOSIS — F331 Major depressive disorder, recurrent, moderate: Secondary | ICD-10-CM | POA: Diagnosis not present

## 2023-07-17 DIAGNOSIS — M9903 Segmental and somatic dysfunction of lumbar region: Secondary | ICD-10-CM | POA: Diagnosis not present

## 2023-07-17 DIAGNOSIS — M9901 Segmental and somatic dysfunction of cervical region: Secondary | ICD-10-CM | POA: Diagnosis not present

## 2023-07-17 DIAGNOSIS — M5032 Other cervical disc degeneration, mid-cervical region, unspecified level: Secondary | ICD-10-CM | POA: Diagnosis not present

## 2023-07-17 DIAGNOSIS — M62830 Muscle spasm of back: Secondary | ICD-10-CM | POA: Diagnosis not present

## 2023-07-18 DIAGNOSIS — F4312 Post-traumatic stress disorder, chronic: Secondary | ICD-10-CM | POA: Diagnosis not present

## 2023-07-18 DIAGNOSIS — F902 Attention-deficit hyperactivity disorder, combined type: Secondary | ICD-10-CM | POA: Diagnosis not present

## 2023-07-18 DIAGNOSIS — F331 Major depressive disorder, recurrent, moderate: Secondary | ICD-10-CM | POA: Diagnosis not present

## 2023-07-22 DIAGNOSIS — R760 Raised antibody titer: Secondary | ICD-10-CM | POA: Diagnosis not present

## 2023-07-22 DIAGNOSIS — F419 Anxiety disorder, unspecified: Secondary | ICD-10-CM | POA: Diagnosis not present

## 2023-07-22 DIAGNOSIS — Z7952 Long term (current) use of systemic steroids: Secondary | ICD-10-CM | POA: Diagnosis not present

## 2023-07-22 DIAGNOSIS — I10 Essential (primary) hypertension: Secondary | ICD-10-CM | POA: Diagnosis not present

## 2023-07-22 DIAGNOSIS — Z4822 Encounter for aftercare following kidney transplant: Secondary | ICD-10-CM | POA: Diagnosis not present

## 2023-07-22 DIAGNOSIS — G43909 Migraine, unspecified, not intractable, without status migrainosus: Secondary | ICD-10-CM | POA: Diagnosis not present

## 2023-07-22 DIAGNOSIS — J302 Other seasonal allergic rhinitis: Secondary | ICD-10-CM | POA: Diagnosis not present

## 2023-07-22 DIAGNOSIS — Z940 Kidney transplant status: Secondary | ICD-10-CM | POA: Diagnosis not present

## 2023-07-22 DIAGNOSIS — Z5181 Encounter for therapeutic drug level monitoring: Secondary | ICD-10-CM | POA: Diagnosis not present

## 2023-07-22 DIAGNOSIS — F909 Attention-deficit hyperactivity disorder, unspecified type: Secondary | ICD-10-CM | POA: Diagnosis not present

## 2023-07-22 DIAGNOSIS — R768 Other specified abnormal immunological findings in serum: Secondary | ICD-10-CM | POA: Diagnosis not present

## 2023-07-22 DIAGNOSIS — Z79899 Other long term (current) drug therapy: Secondary | ICD-10-CM | POA: Diagnosis not present

## 2023-07-25 DIAGNOSIS — F4312 Post-traumatic stress disorder, chronic: Secondary | ICD-10-CM | POA: Diagnosis not present

## 2023-07-25 DIAGNOSIS — F902 Attention-deficit hyperactivity disorder, combined type: Secondary | ICD-10-CM | POA: Diagnosis not present

## 2023-07-25 DIAGNOSIS — F331 Major depressive disorder, recurrent, moderate: Secondary | ICD-10-CM | POA: Diagnosis not present

## 2023-08-01 DIAGNOSIS — F331 Major depressive disorder, recurrent, moderate: Secondary | ICD-10-CM | POA: Diagnosis not present

## 2023-08-01 DIAGNOSIS — F4312 Post-traumatic stress disorder, chronic: Secondary | ICD-10-CM | POA: Diagnosis not present

## 2023-08-01 DIAGNOSIS — F902 Attention-deficit hyperactivity disorder, combined type: Secondary | ICD-10-CM | POA: Diagnosis not present

## 2023-08-09 DIAGNOSIS — J0140 Acute pansinusitis, unspecified: Secondary | ICD-10-CM | POA: Diagnosis not present

## 2023-08-14 DIAGNOSIS — M9903 Segmental and somatic dysfunction of lumbar region: Secondary | ICD-10-CM | POA: Diagnosis not present

## 2023-08-14 DIAGNOSIS — M62830 Muscle spasm of back: Secondary | ICD-10-CM | POA: Diagnosis not present

## 2023-08-14 DIAGNOSIS — M9901 Segmental and somatic dysfunction of cervical region: Secondary | ICD-10-CM | POA: Diagnosis not present

## 2023-08-14 DIAGNOSIS — M5032 Other cervical disc degeneration, mid-cervical region, unspecified level: Secondary | ICD-10-CM | POA: Diagnosis not present

## 2023-08-15 DIAGNOSIS — F902 Attention-deficit hyperactivity disorder, combined type: Secondary | ICD-10-CM | POA: Diagnosis not present

## 2023-08-15 DIAGNOSIS — F4312 Post-traumatic stress disorder, chronic: Secondary | ICD-10-CM | POA: Diagnosis not present

## 2023-08-15 DIAGNOSIS — F331 Major depressive disorder, recurrent, moderate: Secondary | ICD-10-CM | POA: Diagnosis not present

## 2023-08-22 DIAGNOSIS — F331 Major depressive disorder, recurrent, moderate: Secondary | ICD-10-CM | POA: Diagnosis not present

## 2023-08-22 DIAGNOSIS — F902 Attention-deficit hyperactivity disorder, combined type: Secondary | ICD-10-CM | POA: Diagnosis not present

## 2023-08-22 DIAGNOSIS — F4312 Post-traumatic stress disorder, chronic: Secondary | ICD-10-CM | POA: Diagnosis not present

## 2023-08-29 DIAGNOSIS — F4312 Post-traumatic stress disorder, chronic: Secondary | ICD-10-CM | POA: Diagnosis not present

## 2023-08-29 DIAGNOSIS — F902 Attention-deficit hyperactivity disorder, combined type: Secondary | ICD-10-CM | POA: Diagnosis not present

## 2023-08-29 DIAGNOSIS — F331 Major depressive disorder, recurrent, moderate: Secondary | ICD-10-CM | POA: Diagnosis not present

## 2023-09-05 DIAGNOSIS — M9903 Segmental and somatic dysfunction of lumbar region: Secondary | ICD-10-CM | POA: Diagnosis not present

## 2023-09-05 DIAGNOSIS — M5032 Other cervical disc degeneration, mid-cervical region, unspecified level: Secondary | ICD-10-CM | POA: Diagnosis not present

## 2023-09-05 DIAGNOSIS — F4312 Post-traumatic stress disorder, chronic: Secondary | ICD-10-CM | POA: Diagnosis not present

## 2023-09-05 DIAGNOSIS — F902 Attention-deficit hyperactivity disorder, combined type: Secondary | ICD-10-CM | POA: Diagnosis not present

## 2023-09-05 DIAGNOSIS — M62830 Muscle spasm of back: Secondary | ICD-10-CM | POA: Diagnosis not present

## 2023-09-05 DIAGNOSIS — F331 Major depressive disorder, recurrent, moderate: Secondary | ICD-10-CM | POA: Diagnosis not present

## 2023-09-05 DIAGNOSIS — M9901 Segmental and somatic dysfunction of cervical region: Secondary | ICD-10-CM | POA: Diagnosis not present

## 2023-09-13 DIAGNOSIS — M9901 Segmental and somatic dysfunction of cervical region: Secondary | ICD-10-CM | POA: Diagnosis not present

## 2023-09-13 DIAGNOSIS — M62830 Muscle spasm of back: Secondary | ICD-10-CM | POA: Diagnosis not present

## 2023-09-13 DIAGNOSIS — M9903 Segmental and somatic dysfunction of lumbar region: Secondary | ICD-10-CM | POA: Diagnosis not present

## 2023-09-13 DIAGNOSIS — M5032 Other cervical disc degeneration, mid-cervical region, unspecified level: Secondary | ICD-10-CM | POA: Diagnosis not present

## 2023-09-19 DIAGNOSIS — M9901 Segmental and somatic dysfunction of cervical region: Secondary | ICD-10-CM | POA: Diagnosis not present

## 2023-09-19 DIAGNOSIS — M9903 Segmental and somatic dysfunction of lumbar region: Secondary | ICD-10-CM | POA: Diagnosis not present

## 2023-09-19 DIAGNOSIS — M62830 Muscle spasm of back: Secondary | ICD-10-CM | POA: Diagnosis not present

## 2023-09-19 DIAGNOSIS — F902 Attention-deficit hyperactivity disorder, combined type: Secondary | ICD-10-CM | POA: Diagnosis not present

## 2023-09-19 DIAGNOSIS — F331 Major depressive disorder, recurrent, moderate: Secondary | ICD-10-CM | POA: Diagnosis not present

## 2023-09-19 DIAGNOSIS — F4312 Post-traumatic stress disorder, chronic: Secondary | ICD-10-CM | POA: Diagnosis not present

## 2023-09-19 DIAGNOSIS — M5032 Other cervical disc degeneration, mid-cervical region, unspecified level: Secondary | ICD-10-CM | POA: Diagnosis not present

## 2023-09-26 DIAGNOSIS — F331 Major depressive disorder, recurrent, moderate: Secondary | ICD-10-CM | POA: Diagnosis not present

## 2023-09-26 DIAGNOSIS — F4312 Post-traumatic stress disorder, chronic: Secondary | ICD-10-CM | POA: Diagnosis not present

## 2023-09-26 DIAGNOSIS — F902 Attention-deficit hyperactivity disorder, combined type: Secondary | ICD-10-CM | POA: Diagnosis not present

## 2023-10-09 DIAGNOSIS — F4312 Post-traumatic stress disorder, chronic: Secondary | ICD-10-CM | POA: Diagnosis not present

## 2023-10-09 DIAGNOSIS — F902 Attention-deficit hyperactivity disorder, combined type: Secondary | ICD-10-CM | POA: Diagnosis not present

## 2023-10-09 DIAGNOSIS — F331 Major depressive disorder, recurrent, moderate: Secondary | ICD-10-CM | POA: Diagnosis not present

## 2023-10-24 DIAGNOSIS — F331 Major depressive disorder, recurrent, moderate: Secondary | ICD-10-CM | POA: Diagnosis not present

## 2023-10-24 DIAGNOSIS — F4312 Post-traumatic stress disorder, chronic: Secondary | ICD-10-CM | POA: Diagnosis not present

## 2023-10-24 DIAGNOSIS — F902 Attention-deficit hyperactivity disorder, combined type: Secondary | ICD-10-CM | POA: Diagnosis not present

## 2023-10-30 DIAGNOSIS — Z940 Kidney transplant status: Secondary | ICD-10-CM | POA: Diagnosis not present

## 2023-10-30 DIAGNOSIS — M5442 Lumbago with sciatica, left side: Secondary | ICD-10-CM | POA: Diagnosis not present

## 2023-10-30 DIAGNOSIS — N186 End stage renal disease: Secondary | ICD-10-CM | POA: Diagnosis not present

## 2023-10-30 DIAGNOSIS — Z6838 Body mass index (BMI) 38.0-38.9, adult: Secondary | ICD-10-CM | POA: Diagnosis not present

## 2023-11-07 DIAGNOSIS — F331 Major depressive disorder, recurrent, moderate: Secondary | ICD-10-CM | POA: Diagnosis not present

## 2023-11-07 DIAGNOSIS — F902 Attention-deficit hyperactivity disorder, combined type: Secondary | ICD-10-CM | POA: Diagnosis not present

## 2023-11-07 DIAGNOSIS — F4312 Post-traumatic stress disorder, chronic: Secondary | ICD-10-CM | POA: Diagnosis not present

## 2023-11-13 DIAGNOSIS — M5032 Other cervical disc degeneration, mid-cervical region, unspecified level: Secondary | ICD-10-CM | POA: Diagnosis not present

## 2023-11-13 DIAGNOSIS — M9903 Segmental and somatic dysfunction of lumbar region: Secondary | ICD-10-CM | POA: Diagnosis not present

## 2023-11-13 DIAGNOSIS — M9901 Segmental and somatic dysfunction of cervical region: Secondary | ICD-10-CM | POA: Diagnosis not present

## 2023-11-13 DIAGNOSIS — M62830 Muscle spasm of back: Secondary | ICD-10-CM | POA: Diagnosis not present

## 2023-11-14 DIAGNOSIS — F902 Attention-deficit hyperactivity disorder, combined type: Secondary | ICD-10-CM | POA: Diagnosis not present

## 2023-11-14 DIAGNOSIS — F4312 Post-traumatic stress disorder, chronic: Secondary | ICD-10-CM | POA: Diagnosis not present

## 2023-11-14 DIAGNOSIS — F331 Major depressive disorder, recurrent, moderate: Secondary | ICD-10-CM | POA: Diagnosis not present

## 2023-11-15 DIAGNOSIS — M9901 Segmental and somatic dysfunction of cervical region: Secondary | ICD-10-CM | POA: Diagnosis not present

## 2023-11-15 DIAGNOSIS — M9903 Segmental and somatic dysfunction of lumbar region: Secondary | ICD-10-CM | POA: Diagnosis not present

## 2023-11-15 DIAGNOSIS — M5032 Other cervical disc degeneration, mid-cervical region, unspecified level: Secondary | ICD-10-CM | POA: Diagnosis not present

## 2023-11-15 DIAGNOSIS — M62830 Muscle spasm of back: Secondary | ICD-10-CM | POA: Diagnosis not present

## 2023-11-19 DIAGNOSIS — M5032 Other cervical disc degeneration, mid-cervical region, unspecified level: Secondary | ICD-10-CM | POA: Diagnosis not present

## 2023-11-19 DIAGNOSIS — M9901 Segmental and somatic dysfunction of cervical region: Secondary | ICD-10-CM | POA: Diagnosis not present

## 2023-11-19 DIAGNOSIS — M9903 Segmental and somatic dysfunction of lumbar region: Secondary | ICD-10-CM | POA: Diagnosis not present

## 2023-11-19 DIAGNOSIS — M62830 Muscle spasm of back: Secondary | ICD-10-CM | POA: Diagnosis not present

## 2023-11-26 DIAGNOSIS — Z6837 Body mass index (BMI) 37.0-37.9, adult: Secondary | ICD-10-CM | POA: Diagnosis not present

## 2023-11-26 DIAGNOSIS — M5416 Radiculopathy, lumbar region: Secondary | ICD-10-CM | POA: Diagnosis not present

## 2023-11-28 DIAGNOSIS — F4312 Post-traumatic stress disorder, chronic: Secondary | ICD-10-CM | POA: Diagnosis not present

## 2023-11-28 DIAGNOSIS — F902 Attention-deficit hyperactivity disorder, combined type: Secondary | ICD-10-CM | POA: Diagnosis not present

## 2023-11-28 DIAGNOSIS — F331 Major depressive disorder, recurrent, moderate: Secondary | ICD-10-CM | POA: Diagnosis not present

## 2023-11-29 DIAGNOSIS — M9901 Segmental and somatic dysfunction of cervical region: Secondary | ICD-10-CM | POA: Diagnosis not present

## 2023-11-29 DIAGNOSIS — M9903 Segmental and somatic dysfunction of lumbar region: Secondary | ICD-10-CM | POA: Diagnosis not present

## 2023-11-29 DIAGNOSIS — M62830 Muscle spasm of back: Secondary | ICD-10-CM | POA: Diagnosis not present

## 2023-11-29 DIAGNOSIS — M5032 Other cervical disc degeneration, mid-cervical region, unspecified level: Secondary | ICD-10-CM | POA: Diagnosis not present

## 2023-12-05 DIAGNOSIS — F4312 Post-traumatic stress disorder, chronic: Secondary | ICD-10-CM | POA: Diagnosis not present

## 2023-12-05 DIAGNOSIS — F331 Major depressive disorder, recurrent, moderate: Secondary | ICD-10-CM | POA: Diagnosis not present

## 2023-12-05 DIAGNOSIS — F902 Attention-deficit hyperactivity disorder, combined type: Secondary | ICD-10-CM | POA: Diagnosis not present

## 2023-12-10 DIAGNOSIS — M5416 Radiculopathy, lumbar region: Secondary | ICD-10-CM | POA: Diagnosis not present

## 2023-12-10 DIAGNOSIS — M545 Low back pain, unspecified: Secondary | ICD-10-CM | POA: Diagnosis not present

## 2023-12-10 DIAGNOSIS — M79605 Pain in left leg: Secondary | ICD-10-CM | POA: Diagnosis not present

## 2023-12-10 DIAGNOSIS — R200 Anesthesia of skin: Secondary | ICD-10-CM | POA: Diagnosis not present

## 2023-12-17 DIAGNOSIS — N186 End stage renal disease: Secondary | ICD-10-CM | POA: Diagnosis not present

## 2023-12-17 DIAGNOSIS — Z79899 Other long term (current) drug therapy: Secondary | ICD-10-CM | POA: Diagnosis not present

## 2023-12-17 DIAGNOSIS — N181 Chronic kidney disease, stage 1: Secondary | ICD-10-CM | POA: Diagnosis not present

## 2023-12-17 DIAGNOSIS — M5442 Lumbago with sciatica, left side: Secondary | ICD-10-CM | POA: Diagnosis not present

## 2023-12-17 DIAGNOSIS — I10 Essential (primary) hypertension: Secondary | ICD-10-CM | POA: Diagnosis not present

## 2023-12-17 DIAGNOSIS — D849 Immunodeficiency, unspecified: Secondary | ICD-10-CM | POA: Diagnosis not present

## 2023-12-17 DIAGNOSIS — Z13220 Encounter for screening for lipoid disorders: Secondary | ICD-10-CM | POA: Diagnosis not present

## 2023-12-17 DIAGNOSIS — F418 Other specified anxiety disorders: Secondary | ICD-10-CM | POA: Diagnosis not present

## 2023-12-17 DIAGNOSIS — Z940 Kidney transplant status: Secondary | ICD-10-CM | POA: Diagnosis not present

## 2023-12-18 DIAGNOSIS — H60312 Diffuse otitis externa, left ear: Secondary | ICD-10-CM | POA: Diagnosis not present

## 2023-12-18 DIAGNOSIS — D84821 Immunodeficiency due to drugs: Secondary | ICD-10-CM | POA: Diagnosis not present

## 2023-12-18 DIAGNOSIS — N181 Chronic kidney disease, stage 1: Secondary | ICD-10-CM | POA: Diagnosis not present

## 2023-12-18 DIAGNOSIS — R5383 Other fatigue: Secondary | ICD-10-CM | POA: Diagnosis not present

## 2023-12-18 DIAGNOSIS — Z940 Kidney transplant status: Secondary | ICD-10-CM | POA: Diagnosis not present

## 2023-12-23 DIAGNOSIS — F4312 Post-traumatic stress disorder, chronic: Secondary | ICD-10-CM | POA: Diagnosis not present

## 2023-12-23 DIAGNOSIS — F331 Major depressive disorder, recurrent, moderate: Secondary | ICD-10-CM | POA: Diagnosis not present

## 2023-12-23 DIAGNOSIS — F902 Attention-deficit hyperactivity disorder, combined type: Secondary | ICD-10-CM | POA: Diagnosis not present

## 2023-12-31 DIAGNOSIS — H6693 Otitis media, unspecified, bilateral: Secondary | ICD-10-CM | POA: Diagnosis not present

## 2024-01-07 ENCOUNTER — Encounter: Payer: Self-pay | Admitting: Neurology

## 2024-01-08 DIAGNOSIS — H65196 Other acute nonsuppurative otitis media, recurrent, bilateral: Secondary | ICD-10-CM | POA: Diagnosis not present

## 2024-01-08 DIAGNOSIS — J0101 Acute recurrent maxillary sinusitis: Secondary | ICD-10-CM | POA: Diagnosis not present

## 2024-02-26 ENCOUNTER — Other Ambulatory Visit: Payer: Self-pay

## 2024-02-26 DIAGNOSIS — R202 Paresthesia of skin: Secondary | ICD-10-CM

## 2024-03-03 ENCOUNTER — Ambulatory Visit (INDEPENDENT_AMBULATORY_CARE_PROVIDER_SITE_OTHER): Admitting: Neurology

## 2024-03-03 DIAGNOSIS — R202 Paresthesia of skin: Secondary | ICD-10-CM | POA: Diagnosis not present

## 2024-03-03 NOTE — Procedures (Signed)
  [facility]  [address] [place], [address]  [place], [place] [address] Tel: [phone] Fax: [phone] Test Date:  03/03/2024  Patient: Kaitlyn Schwartz DOB: 1985-12-25 Physician: Venetia Potters, MD  Sex: Female   Ref Phys: Rockey Peru, MD  ID#: [id number]       History: This is a 38 year old female with low back and left buttocks pain.  NCV & EMG Findings: Extensive electrodiagnostic evaluation of the left lower limb shows: Left sural and superficial peroneal/fibular sensory responses are within normal limits. Left peroneal/fibular (EDB) and tibial (AH) motor responses are within normal limits. Left H reflex latency is within normal limits. There is no evidence of active or chronic motor axon loss changes affecting any of the tested muscles. Motor unit configuration and recruitment pattern is within normal limits.  Impression: This is a normal study of the left lower limb. In particular, there is no electrodiagnostic evidence of a left lumbosacral (L2-S1) radiculopathy, large fiber sensorimotor neuropathy, or myopathy.    ___________________________ Venetia Potters, MD    Nerve Conduction Studies Motor Nerve Results    Latency Amplitude F-Lat Segment Distance CV Comment  Site (ms) Norm (mV) Norm (ms)  (cm) (m/s) Norm   Left Fibular (EDB) Motor  Ankle 3.0  < 5.5 4.9  > 3.0        Bel fib head 8.8 - 4.3 -  Bel fib head-Ankle 29.5 51  > 40   Pop fossa 10.6 - 4.2 -  Pop fossa-Bel fib head 10 56 -   Left Tibial (AH) Motor  Ankle 3.4  < 6.0 13.2  > 8.0        Knee 10.9 - 9.3 -  Knee-Ankle 35 47  > 40    Sensory Sites    Neg Peak Lat Amplitude (O-P) Segment Distance Velocity Comment  Site (ms) Norm (V) Norm  (cm) (ms)   Left Superficial Fibular Sensory  14 cm-Ankle 2.6  < 4.5 13  > 5 14 cm-Ankle 14    Left Sural Sensory  Calf-Lat mall 2.8  < 4.5 7  > 5 Calf-Lat mall 10     H-Reflex Results    M-Lat H Lat H Neg Amp H-M Lat  Site (ms) (ms) Norm (mV) (ms)  Left  Tibial H-Reflex  Pop fossa 5.9 28.9  < 35.0 1.37 23.0   Electromyography   Side Muscle Ins.Act Fibs Fasc Recrt Amp Dur Poly Activation Comment  Left Tib ant Nml Nml Nml Nml Nml Nml Nml Nml N/A  Left Gastroc MH Nml Nml Nml Nml Nml Nml Nml Nml N/A  Left Vastus lat Nml Nml Nml Nml Nml Nml Nml Nml N/A  Left Iliacus Nml Nml Nml Nml Nml Nml Nml Nml N/A  Left Biceps fem SH Nml Nml Nml Nml Nml Nml Nml Nml N/A  Left Gluteus med Nml Nml Nml Nml Nml Nml Nml Nml N/A  Left Gluteus max Nml Nml Nml Nml Nml Nml Nml Nml N/A      Waveforms:  Motor      Sensory      H-Reflex

## 2024-03-09 DIAGNOSIS — M5416 Radiculopathy, lumbar region: Secondary | ICD-10-CM | POA: Diagnosis not present

## 2024-03-09 DIAGNOSIS — Z6836 Body mass index (BMI) 36.0-36.9, adult: Secondary | ICD-10-CM | POA: Diagnosis not present

## 2024-03-18 DIAGNOSIS — N186 End stage renal disease: Secondary | ICD-10-CM | POA: Diagnosis not present

## 2024-03-18 DIAGNOSIS — F418 Other specified anxiety disorders: Secondary | ICD-10-CM | POA: Diagnosis not present

## 2024-03-18 DIAGNOSIS — E785 Hyperlipidemia, unspecified: Secondary | ICD-10-CM | POA: Diagnosis not present

## 2024-03-18 DIAGNOSIS — M5432 Sciatica, left side: Secondary | ICD-10-CM | POA: Diagnosis not present

## 2024-03-18 DIAGNOSIS — Z940 Kidney transplant status: Secondary | ICD-10-CM | POA: Diagnosis not present

## 2024-03-20 ENCOUNTER — Other Ambulatory Visit: Payer: Self-pay | Admitting: Neurosurgery

## 2024-03-20 ENCOUNTER — Encounter: Payer: Self-pay | Admitting: Neurosurgery

## 2024-03-20 DIAGNOSIS — Z940 Kidney transplant status: Secondary | ICD-10-CM | POA: Diagnosis not present

## 2024-03-20 DIAGNOSIS — M549 Dorsalgia, unspecified: Secondary | ICD-10-CM

## 2024-04-02 ENCOUNTER — Inpatient Hospital Stay: Admission: RE | Admit: 2024-04-02 | Discharge: 2024-04-02 | Attending: Neurosurgery | Admitting: Neurosurgery

## 2024-04-02 DIAGNOSIS — M549 Dorsalgia, unspecified: Secondary | ICD-10-CM

## 2024-04-02 DIAGNOSIS — Z940 Kidney transplant status: Secondary | ICD-10-CM | POA: Diagnosis not present

## 2024-04-13 DIAGNOSIS — M5416 Radiculopathy, lumbar region: Secondary | ICD-10-CM | POA: Diagnosis not present

## 2024-04-13 DIAGNOSIS — Z6837 Body mass index (BMI) 37.0-37.9, adult: Secondary | ICD-10-CM | POA: Diagnosis not present

## 2024-04-13 DIAGNOSIS — M549 Dorsalgia, unspecified: Secondary | ICD-10-CM | POA: Diagnosis not present
